# Patient Record
Sex: Male | Born: 1945
Health system: Southern US, Community
[De-identification: ages and names within clinical notes are randomized; demographics above are authoritative.]

## PROBLEM LIST (undated history)

## (undated) DIAGNOSIS — K645 Perianal venous thrombosis: Secondary | ICD-10-CM

## (undated) DIAGNOSIS — K219 Gastro-esophageal reflux disease without esophagitis: Secondary | ICD-10-CM

## (undated) DIAGNOSIS — I10 Essential (primary) hypertension: Secondary | ICD-10-CM

## (undated) DIAGNOSIS — E7800 Pure hypercholesterolemia, unspecified: Secondary | ICD-10-CM

## (undated) DIAGNOSIS — Z1211 Encounter for screening for malignant neoplasm of colon: Secondary | ICD-10-CM

## (undated) DIAGNOSIS — G500 Trigeminal neuralgia: Secondary | ICD-10-CM

## (undated) DIAGNOSIS — K449 Diaphragmatic hernia without obstruction or gangrene: Secondary | ICD-10-CM

## (undated) DIAGNOSIS — M542 Cervicalgia: Secondary | ICD-10-CM

## (undated) HISTORY — PX: OTHER SURGICAL HISTORY: SHX169

## (undated) HISTORY — DX: Diaphragmatic hernia without obstruction or gangrene: K44.9

## (undated) HISTORY — DX: Trigeminal neuralgia: G50.0

## (undated) HISTORY — DX: Pure hypercholesterolemia, unspecified: E78.00

## (undated) HISTORY — PX: APPENDECTOMY: SHX54

## (undated) HISTORY — DX: Perianal venous thrombosis: K64.5

## (undated) HISTORY — PX: CARPAL TUNNEL RELEASE: SHX101

## (undated) HISTORY — DX: Cervicalgia: M54.2

## (undated) HISTORY — DX: Gastro-esophageal reflux disease without esophagitis: K21.9

## (undated) HISTORY — DX: Essential (primary) hypertension: I10

## (undated) HISTORY — PX: ROTATOR CUFF REPAIR: SHX139

## (undated) HISTORY — PX: COLONOSCOPY: SHX174

## (undated) HISTORY — DX: Encounter for screening for malignant neoplasm of colon: Z12.11

---

## 2002-01-16 ENCOUNTER — Ambulatory Visit (HOSPITAL_BASED_OUTPATIENT_CLINIC_OR_DEPARTMENT_OTHER): Admission: RE | Admit: 2002-01-16 | Discharge: 2002-01-16 | Payer: Self-pay | Admitting: Orthopaedic Surgery

## 2005-09-22 ENCOUNTER — Encounter: Admission: RE | Admit: 2005-09-22 | Discharge: 2005-09-22 | Payer: Self-pay | Admitting: Internal Medicine

## 2005-10-01 ENCOUNTER — Ambulatory Visit (HOSPITAL_COMMUNITY): Admission: RE | Admit: 2005-10-01 | Discharge: 2005-10-01 | Payer: Self-pay | Admitting: Internal Medicine

## 2005-10-28 ENCOUNTER — Encounter: Admission: RE | Admit: 2005-10-28 | Discharge: 2005-10-28 | Payer: Self-pay | Admitting: Internal Medicine

## 2005-11-11 ENCOUNTER — Ambulatory Visit (HOSPITAL_COMMUNITY): Admission: RE | Admit: 2005-11-11 | Discharge: 2005-11-11 | Payer: Self-pay | Admitting: *Deleted

## 2005-11-11 ENCOUNTER — Encounter (INDEPENDENT_AMBULATORY_CARE_PROVIDER_SITE_OTHER): Payer: Self-pay | Admitting: Specialist

## 2007-01-06 ENCOUNTER — Encounter: Admission: RE | Admit: 2007-01-06 | Discharge: 2007-01-06 | Payer: Self-pay | Admitting: Internal Medicine

## 2007-02-02 ENCOUNTER — Encounter: Admission: RE | Admit: 2007-02-02 | Discharge: 2007-02-02 | Payer: Self-pay | Admitting: Internal Medicine

## 2008-08-09 IMAGING — US US ABDOMEN COMPLETE
1 series · 14 of 25 positions shown · non-contrast
Comparison: None.

CLINICAL DATA: Right upper quadrant discomfort.  Rule out gallbladder disease.
 ABDOMEN ULTRASOUND:
TECHNIQUE: Complete abdominal ultrasound examination was performed including evaluation of the liver, gallbladder, bile ducts, pancreas, kidneys, spleen, IVC, and abdominal aorta.

[Series 1: unknown · 0.32mm/px · 14 of 67 slices shown]
[im 1/67]
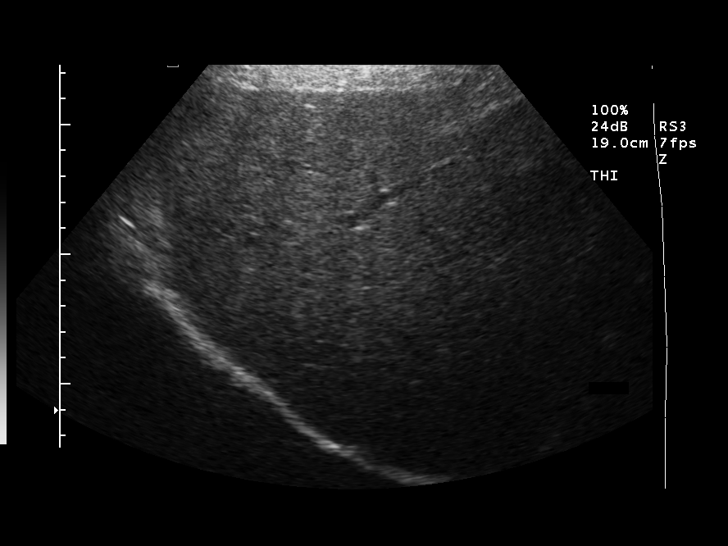
[im 6/67]
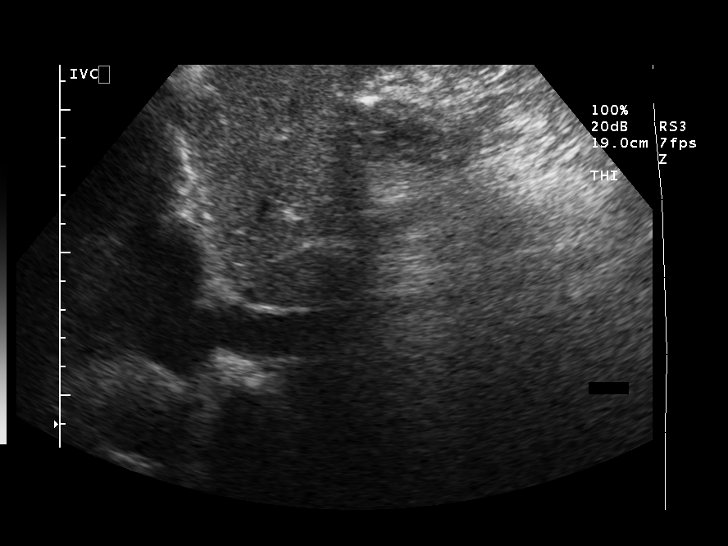
[im 12/67]
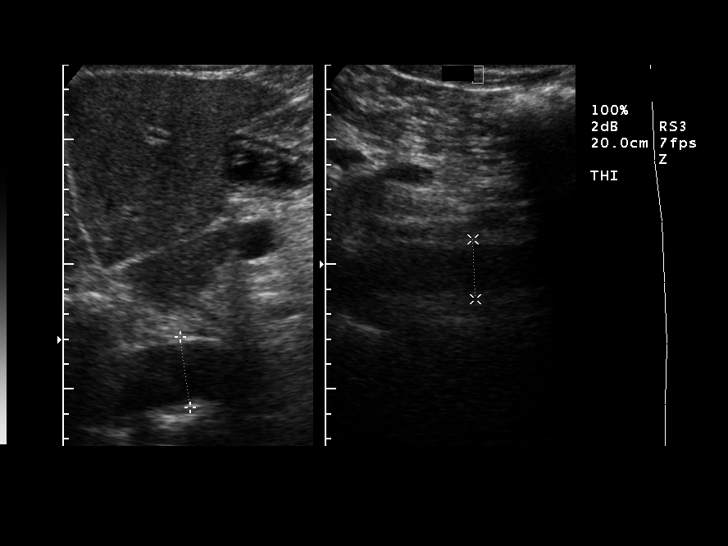
[im 17/67]
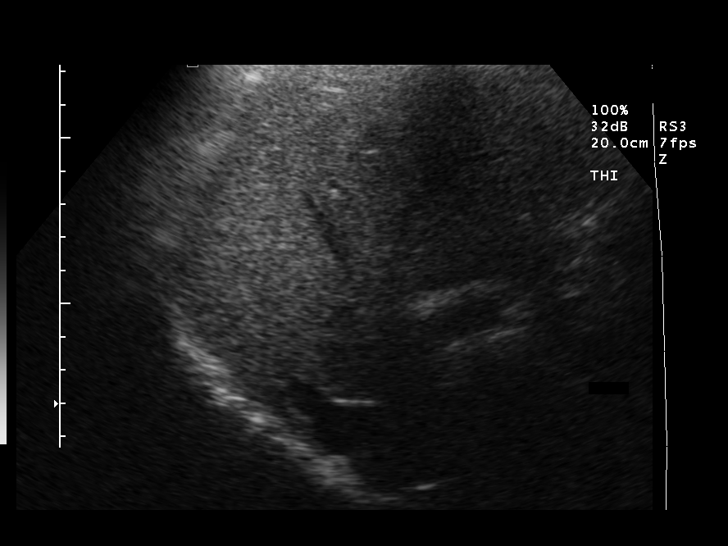
[im 23/67]
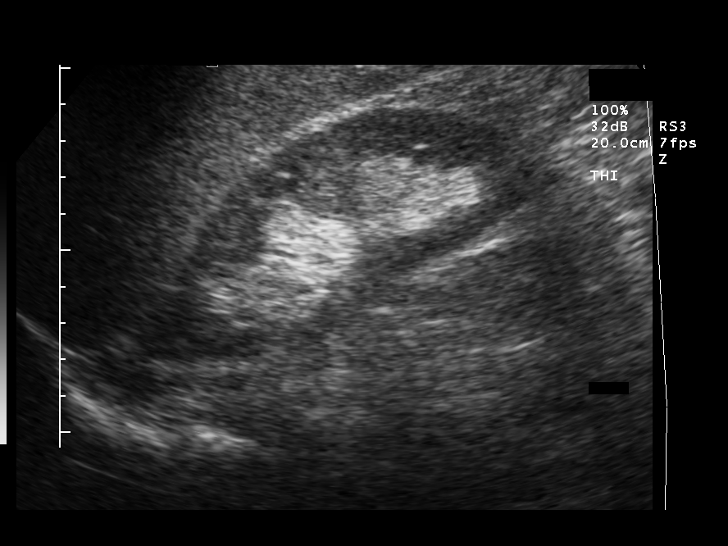
[im 25/67]
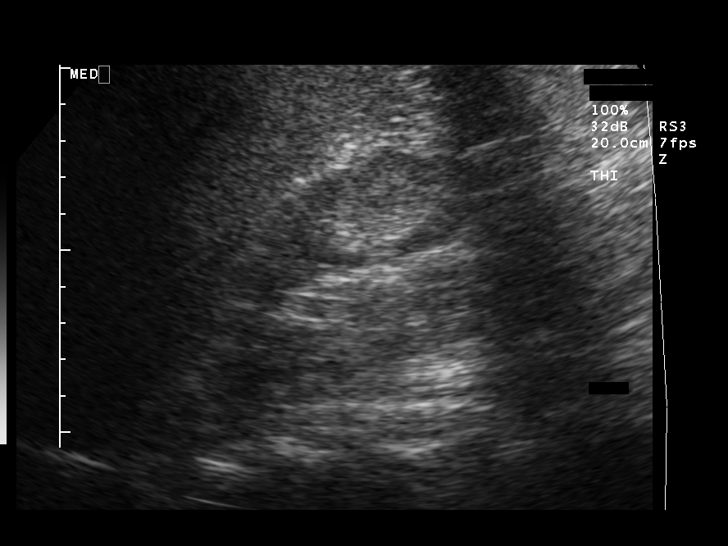
[im 31/67]
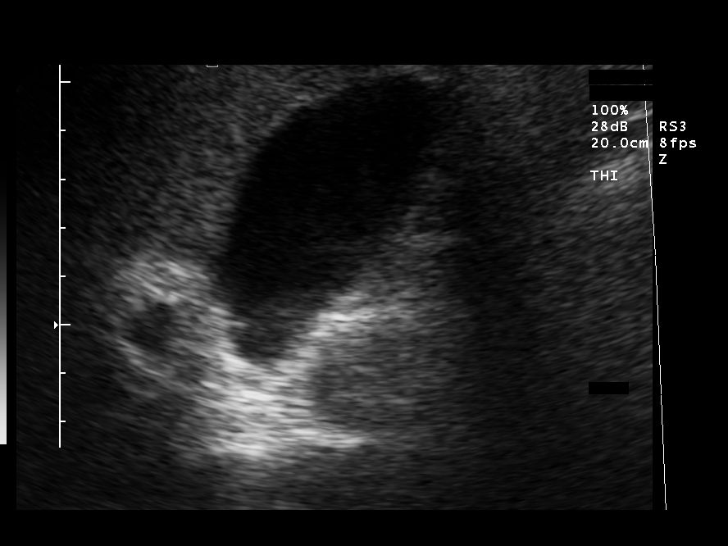
[im 36/67]
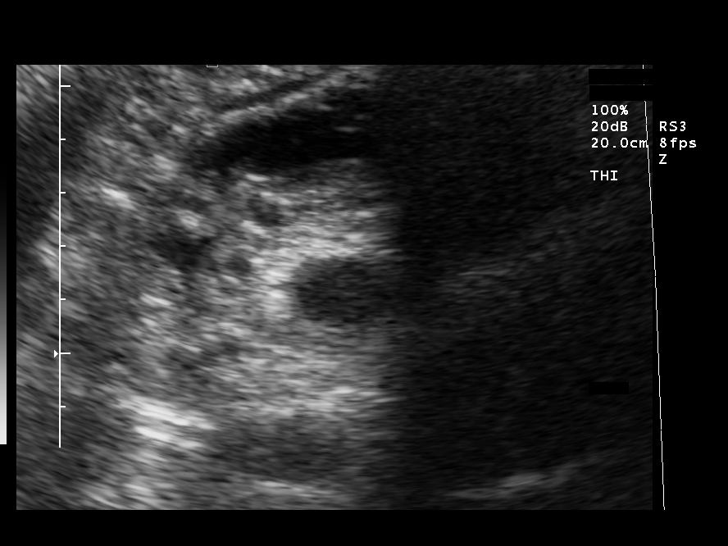
[im 42/67]
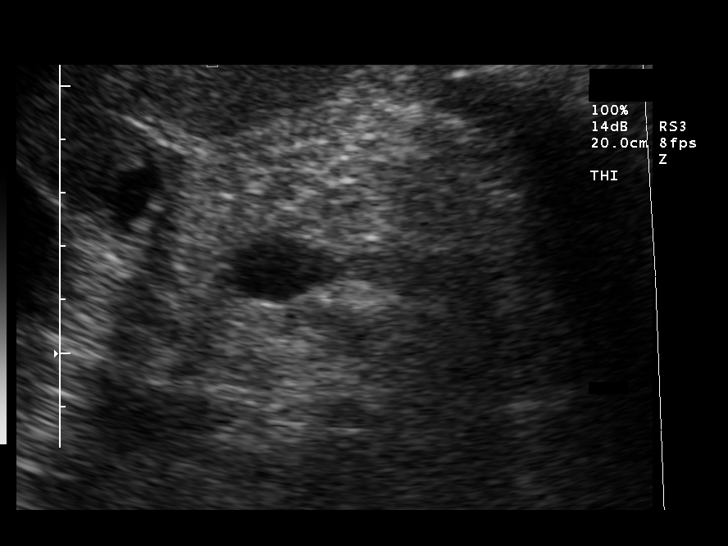
[im 45/67]
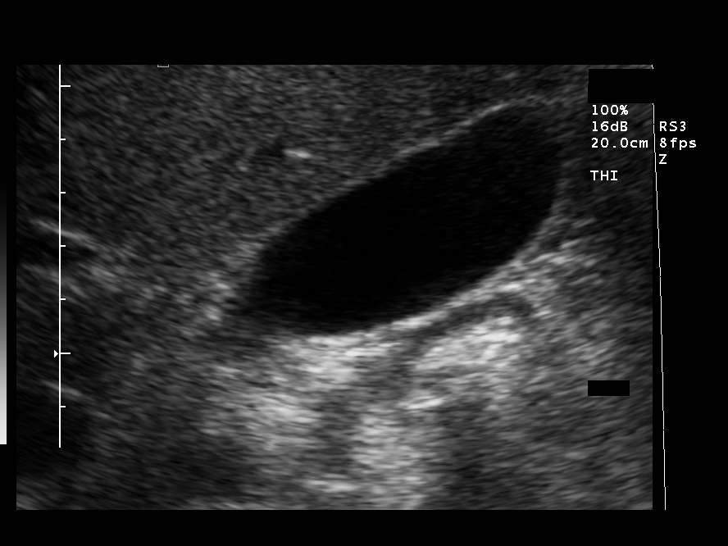
[im 50/67]
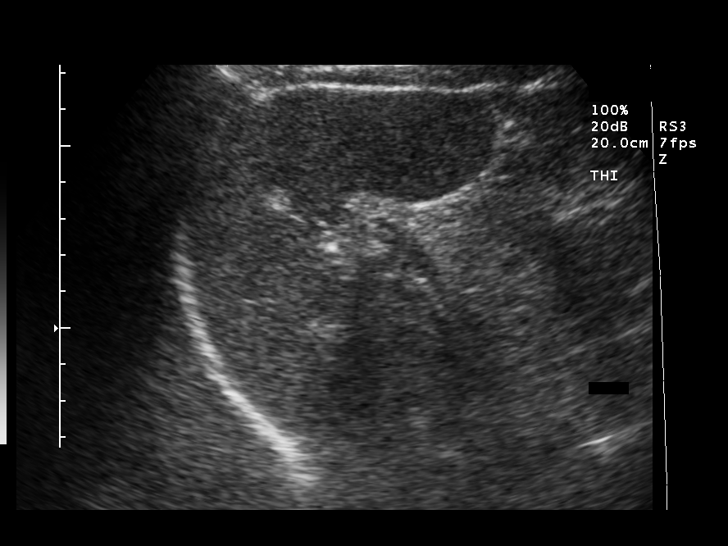
[im 56/67]
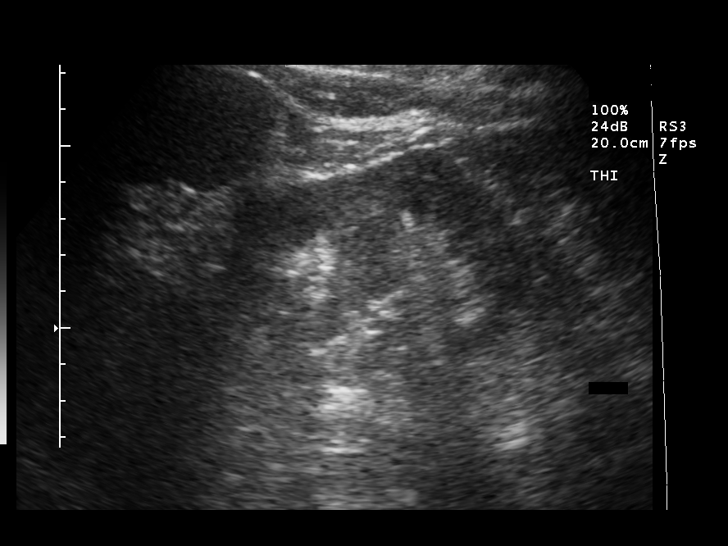
[im 61/67]
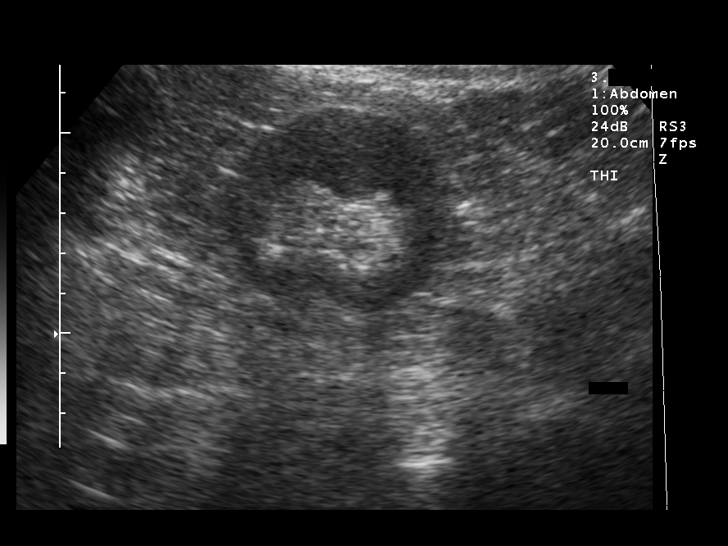
[im 67/67]
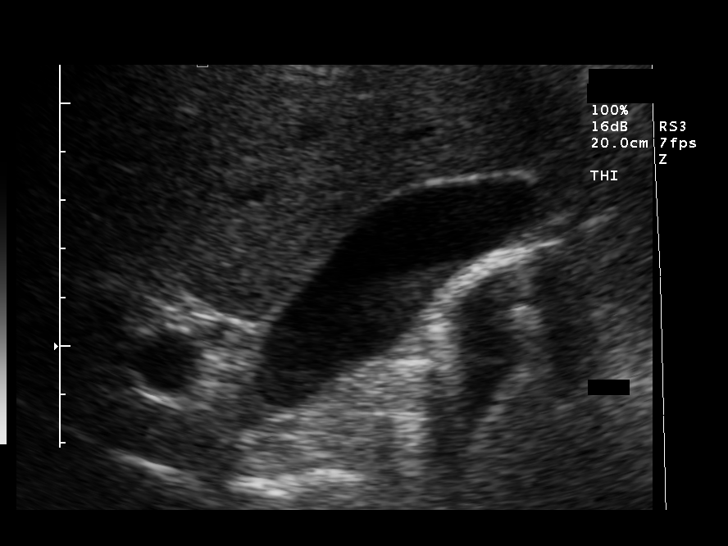

[14 of 25 positions shown; findings below may reference images not displayed]

Gallbladder is normal without wall thickening, stone, or pericholecystic fluid.  The common duct is normal at 4 mm. 
 The liver is normal in echotexture.  Limited evaluation of the IVC and pancreas due to overlying bowel gas.  
 The spleen is normal in size and echotexture. 
 The right kidney is 12.5 and the left kidney is 12.4 cm.  No hydronephrosis. 
 The proximal abdominal aorta measures 2.9 cm.  Distal abdominal aorta is poorly visualized due to overlying bowel gas.
IMPRESSION: 1.  No evidence of cholelithiasis or cholecystitis.
 2.  Somewhat limited exam, as described above.

## 2010-05-15 ENCOUNTER — Inpatient Hospital Stay (INDEPENDENT_AMBULATORY_CARE_PROVIDER_SITE_OTHER)
Admission: RE | Admit: 2010-05-15 | Discharge: 2010-05-15 | Disposition: A | Discharge status: Home / Self Care | Payer: BC Managed Care – PPO | Source: Ambulatory Visit | Attending: Family Medicine | Admitting: Family Medicine

## 2010-05-15 DIAGNOSIS — M549 Dorsalgia, unspecified: Secondary | ICD-10-CM

## 2010-05-22 NOTE — Op Note (Signed)
NAME:  Scott Mclean, Scott Mclean                          ACCOUNT NO.:  [id number]   MEDICAL RECORD NO.:  [id number]                   PATIENT TYPE:  AMB   LOCATION:  DSC                                  FACILITY:  [facility]   PHYSICIAN:  Lubertha Basque. Jerl Santos, M.D.             DATE OF BIRTH:  [birth year]   DATE OF PROCEDURE:  01/16/2002  DATE OF DISCHARGE:                                 OPERATIVE REPORT   PREOPERATIVE DIAGNOSIS:  Left carpal tunnel syndrome.   POSTOPERATIVE DIAGNOSIS:  Left carpal tunnel syndrome.   PROCEDURE:  Left carpal tunnel release.   ANESTHESIA:  Bier block, MAC.   ATTENDING SURGEON:  Lubertha Basque. Jerl Santos, M.D.   ASSISTANT:  Lindwood Qua, P.A.   INDICATIONS FOR PROCEDURE:  The patient is a 65 year old male with a long  history of hand pain and numbness bilateral.  He has failed conservative  measures and underwent a nerve conduction study which showed very severe  left-sided carpal tunnel syndrome and severe right-sided carpal tunnel  syndrome.  He is offered operative release at this point.  The procedure was  discussed with the patient and informed operative consent was obtained after  discussing possible complications, drug reactions to anesthesia, infection  and neurovascular injury.   DESCRIPTION OF PROCEDURE:  The patient was taken to the operating suite  where a Bier block was applied to the level of his forearm.  He was also  given some sedation.  His position was supine and prepped in normal sterile  fashion.  After  administration of  IV antibiotics, a small palmar incision  was made ulnar to the thenar flexion crease.  Dissection was carried down to  the palmar fascia to expose the transverse carpal ligament which was  released under direct visualization.  Dissection was taken distally to the  transverse arch of vessels and proximally to the distal fascia of the  forearm.  Once the release had been completed, the surgeon's small finger  could be inserted  through the carpal tunnel in the forearm without any  difficulty.  He did have severe compression and very significant thickening  of the transverse carpal ligament.  The wound was irrigated followed by  closure of the skin with nylon.  Adaptic was placed on the wound followed by  dry gauze and a Volar splint applied with wrist in extension.  The  tourniquet was deflated and the fingers all became pink and warm  immediately.  Estimated blood loss and intraoperative fluids obtained from  anesthesia records as for an accurate tourniquet time.   DISPOSITION:  The patient was taken to recovery room in stable condition.   PLAN:  For him to go home same day and follow up in the office in one week.  I will contact him by phone tonight.  Lubertha Basque Jerl Santos, M.D.    PGD/MEDQ  D:  01/16/2002  T:  01/16/2002  Job:  [id number]

## 2010-05-22 NOTE — Op Note (Signed)
NAMEWILLY, Scott Mclean                ACCOUNT NO.:  [id number]   MEDICAL RECORD NO.:  [id number]          PATIENT TYPE:  AMB   LOCATION:  ENDO                         FACILITY:  [facility]   PHYSICIAN:  Georgiana Spinner, M.D.    DATE OF BIRTH:  1945/10/17   DATE OF PROCEDURE:  11/11/2005  DATE OF DISCHARGE:                                 OPERATIVE REPORT   PROCEDURE:  Colonoscopy.   INDICATIONS:  Colon polyps.   ANESTHESIA:  Fentanyl 25 mcg, Versed 4 mg.   DESCRIPTION OF PROCEDURE:  With the patient mildly sedated in the left  lateral decubitus position, the Olympus videoscopic colonoscope was inserted  in the rectum after a rectal examination was attempted and passed under  direct vision to the cecum identified by the ileocecal valve and appendiceal  orifice and, also the terminal ileum which was entered.  Photographs were  taken. From this point, the colonoscope was slowly withdrawn taking  circumferential views of colonic mucosa stopping at 40 cm from anal verge at  which point two polyps were seen adjacent to each other, one was  photographed, and both were removed using hot biopsy forceps technique on a  setting of 20/200 blended current. The endoscope was then withdrawn to the  rectum which appeared normal on direct and showed hemorrhoids on retroflexed  view. The endoscope straightened and withdrawn.  The patient's vital signs  and pulse oximeter remained stable.  The patient tolerated the procedure  well without apparent complications.   FINDINGS:  Internal hemorrhoids, two polyps 40 cm, otherwise, an  unremarkable colonoscopic examination.   PLAN:  Await biopsy report.  The patient will call me for results and follow-  up with me as an outpatient.           ______________________________  Georgiana Spinner, M.D.     GMO/MEDQ  D:  11/11/2005  T:  11/11/2005  Job:  [id number]

## 2010-05-22 NOTE — Op Note (Signed)
Scott Mclean, Scott Mclean                ACCOUNT NO.:  [id number]   MEDICAL RECORD NO.:  [id number]          PATIENT TYPE:  AMB   LOCATION:  ENDO                         FACILITY:  [facility]   PHYSICIAN:  Georgiana Spinner, M.D.    DATE OF BIRTH:  Jul 08, 1945   DATE OF PROCEDURE:  DATE OF DISCHARGE:                                 OPERATIVE REPORT   PROCEDURE:  Upper endoscopy.   INDICATIONS:  GERD.   ANESTHESIA:  Fentanyl 50 mcg, Versed 4 mg.   PROCEDURE:  With the patient mildly sedated in the left lateral decubitus  position, the Olympus videoscopic endoscope was inserted into the mouth,  passed under direct vision through the esophagus, which appeared normal  until it reached distal esophagus and there were changes of esophagitis and  Barrett esophagus.  Photographed and multiple biopsies taken.  We entered  into the stomach for hiatal hernia, fundus, body, antrum, duodenal bulb,  second portion of the duodenum were visualized.  From this point, the  endoscope was slowly withdrawn, taking circumferential views of the duodenal  mucosa until the endoscope had been pulled back into the stomach, placed in  retroflexion to view the stomach from below.  The endoscope was then  straightened and withdrawn, taking circumflex views of the remaining gastric  and esophageal mucosa.  The patient's vital signs and pulse oximetry  remained stable.  The patient tolerated the procedure well without apparent  complication.   FINDINGS:  Changes of esophagitis and Barrett esophagus above the hiatal  hernia, awaiting biopsy report.  The patient will call me for results and  follow up with me as an outpatient, receive a colonoscopy.           ______________________________  Georgiana Spinner, M.D.     GMO/MEDQ  D:  11/11/2005  T:  11/12/2005  Job:  [id number]

## 2013-01-04 DIAGNOSIS — S2239XA Fracture of one rib, unspecified side, initial encounter for closed fracture: Secondary | ICD-10-CM

## 2013-01-04 HISTORY — DX: Fracture of one rib, unspecified side, initial encounter for closed fracture: S22.39XA

## 2013-04-13 ENCOUNTER — Other Ambulatory Visit: Payer: Self-pay | Admitting: Gastroenterology

## 2014-11-18 DIAGNOSIS — J0100 Acute maxillary sinusitis, unspecified: Secondary | ICD-10-CM | POA: Diagnosis not present

## 2014-12-31 DIAGNOSIS — I10 Essential (primary) hypertension: Secondary | ICD-10-CM | POA: Diagnosis not present

## 2014-12-31 DIAGNOSIS — R739 Hyperglycemia, unspecified: Secondary | ICD-10-CM | POA: Diagnosis not present

## 2015-01-03 DIAGNOSIS — I10 Essential (primary) hypertension: Secondary | ICD-10-CM | POA: Diagnosis not present

## 2015-01-03 DIAGNOSIS — R7303 Prediabetes: Secondary | ICD-10-CM | POA: Diagnosis not present

## 2015-03-18 DIAGNOSIS — R05 Cough: Secondary | ICD-10-CM | POA: Diagnosis not present

## 2015-03-18 DIAGNOSIS — J0100 Acute maxillary sinusitis, unspecified: Secondary | ICD-10-CM | POA: Diagnosis not present

## 2015-06-24 DIAGNOSIS — E7800 Pure hypercholesterolemia, unspecified: Secondary | ICD-10-CM | POA: Diagnosis not present

## 2015-06-24 DIAGNOSIS — I10 Essential (primary) hypertension: Secondary | ICD-10-CM | POA: Diagnosis not present

## 2015-06-24 DIAGNOSIS — Z125 Encounter for screening for malignant neoplasm of prostate: Secondary | ICD-10-CM | POA: Diagnosis not present

## 2015-07-01 DIAGNOSIS — Z0000 Encounter for general adult medical examination without abnormal findings: Secondary | ICD-10-CM | POA: Diagnosis not present

## 2015-07-01 DIAGNOSIS — I10 Essential (primary) hypertension: Secondary | ICD-10-CM | POA: Diagnosis not present

## 2015-07-01 DIAGNOSIS — Z23 Encounter for immunization: Secondary | ICD-10-CM | POA: Diagnosis not present

## 2015-07-01 DIAGNOSIS — Z1212 Encounter for screening for malignant neoplasm of rectum: Secondary | ICD-10-CM | POA: Diagnosis not present

## 2015-07-04 DIAGNOSIS — I10 Essential (primary) hypertension: Secondary | ICD-10-CM | POA: Diagnosis not present

## 2016-01-02 DIAGNOSIS — E785 Hyperlipidemia, unspecified: Secondary | ICD-10-CM | POA: Diagnosis not present

## 2016-01-02 DIAGNOSIS — I10 Essential (primary) hypertension: Secondary | ICD-10-CM | POA: Diagnosis not present

## 2016-01-16 DIAGNOSIS — M47816 Spondylosis without myelopathy or radiculopathy, lumbar region: Secondary | ICD-10-CM | POA: Diagnosis not present

## 2016-01-29 DIAGNOSIS — M47816 Spondylosis without myelopathy or radiculopathy, lumbar region: Secondary | ICD-10-CM | POA: Diagnosis not present

## 2016-02-04 DIAGNOSIS — M47816 Spondylosis without myelopathy or radiculopathy, lumbar region: Secondary | ICD-10-CM | POA: Diagnosis not present

## 2016-02-04 DIAGNOSIS — E785 Hyperlipidemia, unspecified: Secondary | ICD-10-CM | POA: Diagnosis not present

## 2016-02-04 DIAGNOSIS — I10 Essential (primary) hypertension: Secondary | ICD-10-CM | POA: Diagnosis not present

## 2016-03-10 DIAGNOSIS — M4696 Unspecified inflammatory spondylopathy, lumbar region: Secondary | ICD-10-CM | POA: Diagnosis not present

## 2016-03-12 DIAGNOSIS — M47816 Spondylosis without myelopathy or radiculopathy, lumbar region: Secondary | ICD-10-CM | POA: Diagnosis not present

## 2016-04-06 DIAGNOSIS — M545 Low back pain: Secondary | ICD-10-CM | POA: Diagnosis not present

## 2016-06-24 DIAGNOSIS — Z0000 Encounter for general adult medical examination without abnormal findings: Secondary | ICD-10-CM | POA: Diagnosis not present

## 2016-06-24 DIAGNOSIS — Z125 Encounter for screening for malignant neoplasm of prostate: Secondary | ICD-10-CM | POA: Diagnosis not present

## 2016-06-24 DIAGNOSIS — I10 Essential (primary) hypertension: Secondary | ICD-10-CM | POA: Diagnosis not present

## 2016-07-01 DIAGNOSIS — R739 Hyperglycemia, unspecified: Secondary | ICD-10-CM | POA: Diagnosis not present

## 2016-07-01 DIAGNOSIS — I10 Essential (primary) hypertension: Secondary | ICD-10-CM | POA: Diagnosis not present

## 2016-07-01 DIAGNOSIS — Z0000 Encounter for general adult medical examination without abnormal findings: Secondary | ICD-10-CM | POA: Diagnosis not present

## 2016-07-04 DIAGNOSIS — J0100 Acute maxillary sinusitis, unspecified: Secondary | ICD-10-CM | POA: Diagnosis not present

## 2016-07-10 DIAGNOSIS — J0100 Acute maxillary sinusitis, unspecified: Secondary | ICD-10-CM | POA: Diagnosis not present

## 2016-07-29 DIAGNOSIS — D126 Benign neoplasm of colon, unspecified: Secondary | ICD-10-CM | POA: Diagnosis not present

## 2016-07-29 DIAGNOSIS — K5730 Diverticulosis of large intestine without perforation or abscess without bleeding: Secondary | ICD-10-CM | POA: Diagnosis not present

## 2016-07-29 DIAGNOSIS — Z86010 Personal history of colonic polyps: Secondary | ICD-10-CM | POA: Diagnosis not present

## 2016-07-29 DIAGNOSIS — K635 Polyp of colon: Secondary | ICD-10-CM | POA: Diagnosis not present

## 2016-07-30 DIAGNOSIS — K635 Polyp of colon: Secondary | ICD-10-CM | POA: Diagnosis not present

## 2016-07-30 DIAGNOSIS — D126 Benign neoplasm of colon, unspecified: Secondary | ICD-10-CM | POA: Diagnosis not present

## 2016-08-20 DIAGNOSIS — I10 Essential (primary) hypertension: Secondary | ICD-10-CM | POA: Diagnosis not present

## 2016-08-20 DIAGNOSIS — E785 Hyperlipidemia, unspecified: Secondary | ICD-10-CM | POA: Diagnosis not present

## 2016-10-13 DIAGNOSIS — L03116 Cellulitis of left lower limb: Secondary | ICD-10-CM | POA: Diagnosis not present

## 2016-10-21 DIAGNOSIS — R0789 Other chest pain: Secondary | ICD-10-CM | POA: Diagnosis not present

## 2016-10-21 DIAGNOSIS — I10 Essential (primary) hypertension: Secondary | ICD-10-CM | POA: Diagnosis not present

## 2016-10-28 DIAGNOSIS — R739 Hyperglycemia, unspecified: Secondary | ICD-10-CM | POA: Diagnosis not present

## 2016-10-28 DIAGNOSIS — I10 Essential (primary) hypertension: Secondary | ICD-10-CM | POA: Diagnosis not present

## 2016-11-04 DIAGNOSIS — R739 Hyperglycemia, unspecified: Secondary | ICD-10-CM | POA: Diagnosis not present

## 2016-11-04 DIAGNOSIS — Z125 Encounter for screening for malignant neoplasm of prostate: Secondary | ICD-10-CM | POA: Diagnosis not present

## 2016-11-04 DIAGNOSIS — I10 Essential (primary) hypertension: Secondary | ICD-10-CM | POA: Diagnosis not present

## 2016-11-04 DIAGNOSIS — E7800 Pure hypercholesterolemia, unspecified: Secondary | ICD-10-CM | POA: Diagnosis not present

## 2016-11-04 DIAGNOSIS — Z0000 Encounter for general adult medical examination without abnormal findings: Secondary | ICD-10-CM | POA: Diagnosis not present

## 2017-01-15 DIAGNOSIS — J019 Acute sinusitis, unspecified: Secondary | ICD-10-CM | POA: Diagnosis not present

## 2017-01-15 DIAGNOSIS — J309 Allergic rhinitis, unspecified: Secondary | ICD-10-CM | POA: Diagnosis not present

## 2017-01-18 DIAGNOSIS — J0100 Acute maxillary sinusitis, unspecified: Secondary | ICD-10-CM | POA: Diagnosis not present

## 2017-01-18 DIAGNOSIS — R509 Fever, unspecified: Secondary | ICD-10-CM | POA: Diagnosis not present

## 2017-02-07 ENCOUNTER — Encounter: Payer: Self-pay | Admitting: Podiatry

## 2017-02-07 ENCOUNTER — Ambulatory Visit: Payer: Medicare Other | Admitting: Podiatry

## 2017-02-07 VITALS — BP 126/78 | HR 71 | Resp 18

## 2017-02-07 DIAGNOSIS — L600 Ingrowing nail: Secondary | ICD-10-CM

## 2017-02-07 DIAGNOSIS — M79676 Pain in unspecified toe(s): Secondary | ICD-10-CM | POA: Diagnosis not present

## 2017-02-07 DIAGNOSIS — B351 Tinea unguium: Secondary | ICD-10-CM | POA: Diagnosis not present

## 2017-02-07 MED ORDER — FLUCONAZOLE 150 MG PO TABS: 150.0000 mg | ORAL_TABLET | ORAL | 1 refills | Status: DC

## 2017-02-07 NOTE — Patient Instructions (Signed)

## 2017-02-07 NOTE — Progress Notes (Signed)
  Subjective:  Patient ID: Scott Mclean, male    DOB: 1945/11/10,  MRN: [id number]  Chief Complaint  Patient presents with  . Foot Problem    i have some toenails that are thick and i am not a diabetic     72 y.o. male presents with a chronic ingrown toenail to the left great toe. Had this issue on the right great toe in the 70s and had it permanently removed. Would like permanent solution so the nail does not grow back and continue to cause him pain.  Also complains of general thickness and discoloration to all the toenails.  History reviewed. No pertinent past medical history. History reviewed. No pertinent surgical history.  Current Outpatient Medications:  .  fluconazole (DIFLUCAN) 150 MG tablet, Take 1 tablet (150 mg total) by mouth once a week., Disp: 6 tablet, Rfl: 1 .  hydrochlorothiazide (HYDRODIURIL) 25 MG tablet, , Disp: , Rfl:  .  olmesartan (BENICAR) 40 MG tablet, , Disp: , Rfl:   Allergies  Allergen Reactions  . Lisinopril    Review of Systems Objective:   Vitals:   02/07/17 1344  BP: 126/78  Pulse: 71  Resp: 18   General AA&O x3. Normal mood and affect.  Vascular Dorsalis pedis and posterior tibial pulses  present 2+ bilaterally  Capillary refill normal to all digits. Pedal hair growth normal.  Neurologic Epicritic sensation grossly present.  Dermatologic No open lesions. Interspaces clear of maceration. Nails well groomed and normal in appearance. Painful ingrowing nail at lateral nail borders of the hallux nail left. Thickening, brittleness, and yellow discoloration noted to the toenails with subungual debris.  Orthopedic: MMT 5/5 in dorsiflexion, plantarflexion, inversion, and eversion. Normal joint ROM without pain or crepitus. Pain to palpation about the ingrown nail.   Assessment & Plan:  Patient was evaluated and treated and all questions answered.  Ingrown Nail, left -Patient elects to proceed with ingrown toenail removal today -Ingrown  nail excised. See procedure note. -Educated on post-procedure care including soaking. Written instructions provided. -Patient to follow up in 2 weeks for nail check.  Procedure: Excision of Ingrown Toenail Location: Left 1st toe lateral nail borders. Anesthesia: Lidocaine 1% plain; 1.5 mL and Marcaine 0.5% plain; 1.5 mL, digital block. Skin Prep: Betadine. Dressing: Silvadene; telfa; dry, sterile, compression dressing. Technique: Following skin prep, the toe was exsanguinated and a tourniquet was secured at the base of the toe. The affected nail border was freed, split with a nail splitter, and excised. Chemical matrixectomy was then performed with phenol and irrigated out with alcohol. The tourniquet was then removed and sterile dressing applied. Disposition: Patient tolerated procedure well. Patient to return in 2 weeks for follow-up.   Onychomycosis -Oral options reviewed. Will rx oral fluconazole weekly. Discussed low risk of liver injury, no need for monitoring with this medication.  Return in about 2 weeks (around 02/21/2017) for Nail Check.

## 2017-02-22 ENCOUNTER — Ambulatory Visit (INDEPENDENT_AMBULATORY_CARE_PROVIDER_SITE_OTHER): Payer: Medicare Other | Admitting: Podiatry

## 2017-02-22 DIAGNOSIS — Z9889 Other specified postprocedural states: Secondary | ICD-10-CM

## 2017-02-22 NOTE — Progress Notes (Signed)
  Subjective:  Patient ID: Scott Mclean, male    DOB: [birth year],  MRN: [id number]  Chief Complaint  Patient presents with  . Ingrown Toenail    2 wk fu ingrown nail removal doing well    72 y.o. male returns for the above complaint. Doing well. States he is pleased the nail no longer hurts him as it used to.  Objective:   General AA&O x3. Normal mood and affect.  Vascular Foot warm and well perfused with good capillary refill.  Neurologic Sensation grossly intact.  Dermatologic Nail avulsion site healing well without drainage or erythema. Nail bed with overlying soft crust No signs of local infection.  Orthopedic: No tenderness to palpation of the toe.   Assessment & Plan:  Patient was evaluated and treated and all questions answered.  S/p Ingrown Toenail Excision, Left -Healing well without issue. -Discussed continued soaking until redness resolves. -Nail bed gently debrided with a curette. -Discussed return precautions. -F/u PRN

## 2017-04-05 DIAGNOSIS — I10 Essential (primary) hypertension: Secondary | ICD-10-CM | POA: Diagnosis not present

## 2017-05-14 DIAGNOSIS — J019 Acute sinusitis, unspecified: Secondary | ICD-10-CM | POA: Diagnosis not present

## 2017-07-11 DIAGNOSIS — Z125 Encounter for screening for malignant neoplasm of prostate: Secondary | ICD-10-CM | POA: Diagnosis not present

## 2017-07-11 DIAGNOSIS — R739 Hyperglycemia, unspecified: Secondary | ICD-10-CM | POA: Diagnosis not present

## 2017-07-11 DIAGNOSIS — Z0000 Encounter for general adult medical examination without abnormal findings: Secondary | ICD-10-CM | POA: Diagnosis not present

## 2017-07-11 DIAGNOSIS — I10 Essential (primary) hypertension: Secondary | ICD-10-CM | POA: Diagnosis not present

## 2017-07-14 DIAGNOSIS — E7800 Pure hypercholesterolemia, unspecified: Secondary | ICD-10-CM | POA: Diagnosis not present

## 2017-07-14 DIAGNOSIS — K219 Gastro-esophageal reflux disease without esophagitis: Secondary | ICD-10-CM | POA: Diagnosis not present

## 2017-07-14 DIAGNOSIS — Z0000 Encounter for general adult medical examination without abnormal findings: Secondary | ICD-10-CM | POA: Diagnosis not present

## 2017-07-14 DIAGNOSIS — I10 Essential (primary) hypertension: Secondary | ICD-10-CM | POA: Diagnosis not present

## 2017-07-14 DIAGNOSIS — K449 Diaphragmatic hernia without obstruction or gangrene: Secondary | ICD-10-CM | POA: Diagnosis not present

## 2017-08-05 DIAGNOSIS — H61001 Unspecified perichondritis of right external ear: Secondary | ICD-10-CM | POA: Diagnosis not present

## 2017-08-05 DIAGNOSIS — D225 Melanocytic nevi of trunk: Secondary | ICD-10-CM | POA: Diagnosis not present

## 2017-08-22 DIAGNOSIS — M25512 Pain in left shoulder: Secondary | ICD-10-CM | POA: Diagnosis not present

## 2017-08-25 DIAGNOSIS — Z0189 Encounter for other specified special examinations: Secondary | ICD-10-CM | POA: Diagnosis not present

## 2017-08-25 DIAGNOSIS — I10 Essential (primary) hypertension: Secondary | ICD-10-CM | POA: Diagnosis not present

## 2017-09-26 DIAGNOSIS — M67912 Unspecified disorder of synovium and tendon, left shoulder: Secondary | ICD-10-CM | POA: Diagnosis not present

## 2017-09-27 DIAGNOSIS — M47816 Spondylosis without myelopathy or radiculopathy, lumbar region: Secondary | ICD-10-CM | POA: Diagnosis not present

## 2017-10-01 DIAGNOSIS — M25512 Pain in left shoulder: Secondary | ICD-10-CM | POA: Diagnosis not present

## 2017-10-03 DIAGNOSIS — M25512 Pain in left shoulder: Secondary | ICD-10-CM | POA: Diagnosis not present

## 2017-10-13 DIAGNOSIS — M75122 Complete rotator cuff tear or rupture of left shoulder, not specified as traumatic: Secondary | ICD-10-CM | POA: Diagnosis not present

## 2017-10-13 DIAGNOSIS — M24112 Other articular cartilage disorders, left shoulder: Secondary | ICD-10-CM | POA: Diagnosis not present

## 2017-10-13 DIAGNOSIS — M7522 Bicipital tendinitis, left shoulder: Secondary | ICD-10-CM | POA: Diagnosis not present

## 2017-10-13 DIAGNOSIS — G8918 Other acute postprocedural pain: Secondary | ICD-10-CM | POA: Diagnosis not present

## 2017-10-26 DIAGNOSIS — Z9889 Other specified postprocedural states: Secondary | ICD-10-CM | POA: Diagnosis not present

## 2017-11-08 DIAGNOSIS — J069 Acute upper respiratory infection, unspecified: Secondary | ICD-10-CM | POA: Diagnosis not present

## 2017-11-16 DIAGNOSIS — Z9889 Other specified postprocedural states: Secondary | ICD-10-CM | POA: Diagnosis not present

## 2017-11-21 DIAGNOSIS — Z4789 Encounter for other orthopedic aftercare: Secondary | ICD-10-CM | POA: Diagnosis not present

## 2017-11-21 DIAGNOSIS — M75102 Unspecified rotator cuff tear or rupture of left shoulder, not specified as traumatic: Secondary | ICD-10-CM | POA: Diagnosis not present

## 2017-11-21 DIAGNOSIS — M25512 Pain in left shoulder: Secondary | ICD-10-CM | POA: Diagnosis not present

## 2017-11-25 DIAGNOSIS — M25512 Pain in left shoulder: Secondary | ICD-10-CM | POA: Diagnosis not present

## 2017-11-25 DIAGNOSIS — Z4789 Encounter for other orthopedic aftercare: Secondary | ICD-10-CM | POA: Diagnosis not present

## 2017-11-25 DIAGNOSIS — M75102 Unspecified rotator cuff tear or rupture of left shoulder, not specified as traumatic: Secondary | ICD-10-CM | POA: Diagnosis not present

## 2017-11-28 DIAGNOSIS — M25512 Pain in left shoulder: Secondary | ICD-10-CM | POA: Diagnosis not present

## 2017-11-28 DIAGNOSIS — M75102 Unspecified rotator cuff tear or rupture of left shoulder, not specified as traumatic: Secondary | ICD-10-CM | POA: Diagnosis not present

## 2017-11-28 DIAGNOSIS — Z4789 Encounter for other orthopedic aftercare: Secondary | ICD-10-CM | POA: Diagnosis not present

## 2017-12-05 DIAGNOSIS — R251 Tremor, unspecified: Secondary | ICD-10-CM | POA: Diagnosis not present

## 2017-12-05 DIAGNOSIS — I10 Essential (primary) hypertension: Secondary | ICD-10-CM | POA: Diagnosis not present

## 2017-12-06 DIAGNOSIS — Z4789 Encounter for other orthopedic aftercare: Secondary | ICD-10-CM | POA: Diagnosis not present

## 2017-12-06 DIAGNOSIS — M25512 Pain in left shoulder: Secondary | ICD-10-CM | POA: Diagnosis not present

## 2017-12-06 DIAGNOSIS — M75102 Unspecified rotator cuff tear or rupture of left shoulder, not specified as traumatic: Secondary | ICD-10-CM | POA: Diagnosis not present

## 2017-12-08 DIAGNOSIS — Z4789 Encounter for other orthopedic aftercare: Secondary | ICD-10-CM | POA: Diagnosis not present

## 2017-12-08 DIAGNOSIS — M75102 Unspecified rotator cuff tear or rupture of left shoulder, not specified as traumatic: Secondary | ICD-10-CM | POA: Diagnosis not present

## 2017-12-08 DIAGNOSIS — M25512 Pain in left shoulder: Secondary | ICD-10-CM | POA: Diagnosis not present

## 2017-12-14 DIAGNOSIS — Z4789 Encounter for other orthopedic aftercare: Secondary | ICD-10-CM | POA: Diagnosis not present

## 2017-12-14 DIAGNOSIS — M75102 Unspecified rotator cuff tear or rupture of left shoulder, not specified as traumatic: Secondary | ICD-10-CM | POA: Diagnosis not present

## 2017-12-14 DIAGNOSIS — M25512 Pain in left shoulder: Secondary | ICD-10-CM | POA: Diagnosis not present

## 2017-12-14 DIAGNOSIS — Z9889 Other specified postprocedural states: Secondary | ICD-10-CM | POA: Diagnosis not present

## 2017-12-21 DIAGNOSIS — M25512 Pain in left shoulder: Secondary | ICD-10-CM | POA: Diagnosis not present

## 2017-12-21 DIAGNOSIS — Z4789 Encounter for other orthopedic aftercare: Secondary | ICD-10-CM | POA: Diagnosis not present

## 2017-12-21 DIAGNOSIS — M75102 Unspecified rotator cuff tear or rupture of left shoulder, not specified as traumatic: Secondary | ICD-10-CM | POA: Diagnosis not present

## 2017-12-23 DIAGNOSIS — M75102 Unspecified rotator cuff tear or rupture of left shoulder, not specified as traumatic: Secondary | ICD-10-CM | POA: Diagnosis not present

## 2017-12-23 DIAGNOSIS — Z4789 Encounter for other orthopedic aftercare: Secondary | ICD-10-CM | POA: Diagnosis not present

## 2017-12-23 DIAGNOSIS — M25512 Pain in left shoulder: Secondary | ICD-10-CM | POA: Diagnosis not present

## 2017-12-27 DIAGNOSIS — M25512 Pain in left shoulder: Secondary | ICD-10-CM | POA: Diagnosis not present

## 2017-12-27 DIAGNOSIS — Z4789 Encounter for other orthopedic aftercare: Secondary | ICD-10-CM | POA: Diagnosis not present

## 2017-12-27 DIAGNOSIS — M75102 Unspecified rotator cuff tear or rupture of left shoulder, not specified as traumatic: Secondary | ICD-10-CM | POA: Diagnosis not present

## 2017-12-30 DIAGNOSIS — M75102 Unspecified rotator cuff tear or rupture of left shoulder, not specified as traumatic: Secondary | ICD-10-CM | POA: Diagnosis not present

## 2017-12-30 DIAGNOSIS — M25512 Pain in left shoulder: Secondary | ICD-10-CM | POA: Diagnosis not present

## 2017-12-30 DIAGNOSIS — Z4789 Encounter for other orthopedic aftercare: Secondary | ICD-10-CM | POA: Diagnosis not present

## 2018-01-02 DIAGNOSIS — Z4789 Encounter for other orthopedic aftercare: Secondary | ICD-10-CM | POA: Diagnosis not present

## 2018-01-02 DIAGNOSIS — M75102 Unspecified rotator cuff tear or rupture of left shoulder, not specified as traumatic: Secondary | ICD-10-CM | POA: Diagnosis not present

## 2018-01-02 DIAGNOSIS — M25512 Pain in left shoulder: Secondary | ICD-10-CM | POA: Diagnosis not present

## 2018-01-05 DIAGNOSIS — M75102 Unspecified rotator cuff tear or rupture of left shoulder, not specified as traumatic: Secondary | ICD-10-CM | POA: Diagnosis not present

## 2018-01-05 DIAGNOSIS — M25512 Pain in left shoulder: Secondary | ICD-10-CM | POA: Diagnosis not present

## 2018-01-05 DIAGNOSIS — Z4789 Encounter for other orthopedic aftercare: Secondary | ICD-10-CM | POA: Diagnosis not present

## 2018-01-10 DIAGNOSIS — E7800 Pure hypercholesterolemia, unspecified: Secondary | ICD-10-CM | POA: Diagnosis not present

## 2018-01-10 DIAGNOSIS — R739 Hyperglycemia, unspecified: Secondary | ICD-10-CM | POA: Diagnosis not present

## 2018-01-10 DIAGNOSIS — M47816 Spondylosis without myelopathy or radiculopathy, lumbar region: Secondary | ICD-10-CM | POA: Diagnosis not present

## 2018-01-10 DIAGNOSIS — I10 Essential (primary) hypertension: Secondary | ICD-10-CM | POA: Diagnosis not present

## 2018-01-11 DIAGNOSIS — M75102 Unspecified rotator cuff tear or rupture of left shoulder, not specified as traumatic: Secondary | ICD-10-CM | POA: Diagnosis not present

## 2018-01-11 DIAGNOSIS — M25512 Pain in left shoulder: Secondary | ICD-10-CM | POA: Diagnosis not present

## 2018-01-11 DIAGNOSIS — Z4789 Encounter for other orthopedic aftercare: Secondary | ICD-10-CM | POA: Diagnosis not present

## 2018-01-16 DIAGNOSIS — E7800 Pure hypercholesterolemia, unspecified: Secondary | ICD-10-CM | POA: Diagnosis not present

## 2018-01-16 DIAGNOSIS — I10 Essential (primary) hypertension: Secondary | ICD-10-CM | POA: Diagnosis not present

## 2018-01-16 DIAGNOSIS — R739 Hyperglycemia, unspecified: Secondary | ICD-10-CM | POA: Diagnosis not present

## 2018-01-20 DIAGNOSIS — M75102 Unspecified rotator cuff tear or rupture of left shoulder, not specified as traumatic: Secondary | ICD-10-CM | POA: Diagnosis not present

## 2018-01-20 DIAGNOSIS — M25512 Pain in left shoulder: Secondary | ICD-10-CM | POA: Diagnosis not present

## 2018-01-20 DIAGNOSIS — Z4789 Encounter for other orthopedic aftercare: Secondary | ICD-10-CM | POA: Diagnosis not present

## 2018-01-26 DIAGNOSIS — M25512 Pain in left shoulder: Secondary | ICD-10-CM | POA: Diagnosis not present

## 2018-01-26 DIAGNOSIS — M75102 Unspecified rotator cuff tear or rupture of left shoulder, not specified as traumatic: Secondary | ICD-10-CM | POA: Diagnosis not present

## 2018-01-26 DIAGNOSIS — Z4789 Encounter for other orthopedic aftercare: Secondary | ICD-10-CM | POA: Diagnosis not present

## 2018-02-03 DIAGNOSIS — M75102 Unspecified rotator cuff tear or rupture of left shoulder, not specified as traumatic: Secondary | ICD-10-CM | POA: Diagnosis not present

## 2018-02-03 DIAGNOSIS — Z4789 Encounter for other orthopedic aftercare: Secondary | ICD-10-CM | POA: Diagnosis not present

## 2018-02-03 DIAGNOSIS — M25512 Pain in left shoulder: Secondary | ICD-10-CM | POA: Diagnosis not present

## 2018-02-13 DIAGNOSIS — M25512 Pain in left shoulder: Secondary | ICD-10-CM | POA: Diagnosis not present

## 2018-02-21 DIAGNOSIS — M545 Low back pain: Secondary | ICD-10-CM | POA: Diagnosis not present

## 2018-02-27 DIAGNOSIS — M47816 Spondylosis without myelopathy or radiculopathy, lumbar region: Secondary | ICD-10-CM | POA: Diagnosis not present

## 2018-03-06 DIAGNOSIS — M545 Low back pain: Secondary | ICD-10-CM | POA: Diagnosis not present

## 2018-03-08 DIAGNOSIS — M48061 Spinal stenosis, lumbar region without neurogenic claudication: Secondary | ICD-10-CM | POA: Diagnosis not present

## 2018-03-14 DIAGNOSIS — J0100 Acute maxillary sinusitis, unspecified: Secondary | ICD-10-CM | POA: Diagnosis not present

## 2018-03-23 DIAGNOSIS — J0100 Acute maxillary sinusitis, unspecified: Secondary | ICD-10-CM | POA: Diagnosis not present

## 2018-03-23 DIAGNOSIS — J209 Acute bronchitis, unspecified: Secondary | ICD-10-CM | POA: Diagnosis not present

## 2018-03-26 DIAGNOSIS — J309 Allergic rhinitis, unspecified: Secondary | ICD-10-CM | POA: Diagnosis not present

## 2018-07-12 DIAGNOSIS — E039 Hypothyroidism, unspecified: Secondary | ICD-10-CM | POA: Diagnosis not present

## 2018-07-12 DIAGNOSIS — E785 Hyperlipidemia, unspecified: Secondary | ICD-10-CM | POA: Diagnosis not present

## 2018-07-12 DIAGNOSIS — Z0000 Encounter for general adult medical examination without abnormal findings: Secondary | ICD-10-CM | POA: Diagnosis not present

## 2018-07-12 DIAGNOSIS — R739 Hyperglycemia, unspecified: Secondary | ICD-10-CM | POA: Diagnosis not present

## 2018-07-12 DIAGNOSIS — I10 Essential (primary) hypertension: Secondary | ICD-10-CM | POA: Diagnosis not present

## 2018-07-20 DIAGNOSIS — I10 Essential (primary) hypertension: Secondary | ICD-10-CM | POA: Diagnosis not present

## 2018-07-20 DIAGNOSIS — K219 Gastro-esophageal reflux disease without esophagitis: Secondary | ICD-10-CM | POA: Diagnosis not present

## 2018-07-20 DIAGNOSIS — E7800 Pure hypercholesterolemia, unspecified: Secondary | ICD-10-CM | POA: Diagnosis not present

## 2018-07-20 DIAGNOSIS — K449 Diaphragmatic hernia without obstruction or gangrene: Secondary | ICD-10-CM | POA: Diagnosis not present

## 2018-07-20 DIAGNOSIS — Z0000 Encounter for general adult medical examination without abnormal findings: Secondary | ICD-10-CM | POA: Diagnosis not present

## 2018-08-01 DIAGNOSIS — M4807 Spinal stenosis, lumbosacral region: Secondary | ICD-10-CM | POA: Diagnosis not present

## 2018-08-09 ENCOUNTER — Other Ambulatory Visit: Payer: Self-pay | Admitting: Orthopedic Surgery

## 2018-08-16 DIAGNOSIS — R1311 Dysphagia, oral phase: Secondary | ICD-10-CM | POA: Diagnosis not present

## 2018-08-18 ENCOUNTER — Other Ambulatory Visit: Payer: Self-pay | Admitting: Cardiology

## 2018-09-05 DIAGNOSIS — M4807 Spinal stenosis, lumbosacral region: Secondary | ICD-10-CM | POA: Diagnosis not present

## 2018-09-06 NOTE — Pre-Procedure Instructions (Signed)
[place] [place] [id number]  [place] [id number] Phone: [phone] Fax: [phone]      Your procedure is scheduled on Wednesday, September 9th.  Report to [facility] Main Entrance "A" at 10:00 A.M., and check in at the Admitting office.  Call this number if you have problems the morning of surgery:  [phone]  Call [phone] if you have any questions prior to your surgery date Monday-Friday 8am-4pm    Remember:  Do not eat after midnight the night before your surgery  You may drink clear liquids until 10:00 the morning of your surgery.   Clear liquids allowed are: Water, Non-Citrus Juices (without pulp), Carbonated Beverages, Clear Tea, Black Coffee Only, and Gatorade.    Enhanced Recovery after Surgery for Orthopedics Enhanced Recovery after Surgery is a protocol used to improve the stress on your body and your recovery after surgery.  Patient Instructions  . The night before surgery:  o No food after midnight. ONLY clear liquids after midnight  .  Marland Kitchen The day of surgery (if you do NOT have diabetes):  o Drink ONE (1) Pre-Surgery Clear Ensure as directed.   o This drink was given to you during your hospital  pre-op appointment visit. o The pre-op nurse will instruct you on the time to drink the  Pre-Surgery Ensure depending on your surgery time. o Finish the drink at the designated time by the pre-op nurse.  o Nothing else to drink after completing the  Pre-Surgery Clear Ensure.          If you have questions, please contact your surgeon's office.     Take these medicines the morning of surgery with A SIP OF WATER:  amLODipine (NORVASC)  Follow your surgeon's instructions on when to stop Aspirin.  If no instructions were given by your surgeon then you will need to call the office to get those instructions.    As of today, STOP taking any Aspirin (unless otherwise instructed by your surgeon), Aleve,  Naproxen, Ibuprofen, Motrin, Advil, Goody's, BC's, all herbal medications, fish oil, and all vitamins.    The Morning of Surgery  Do not wear jewelry.  Do not wear lotions, powders, or colognes, or deodorant  Men may shave face and neck.  Do not bring valuables to the hospital.  [facility] is not responsible for any belongings or valuables.  If you are a smoker, DO NOT Smoke 24 hours prior to surgery IF you wear a CPAP at night please bring your mask, tubing, and machine the morning of surgery   Remember that you must have someone to transport you home after your surgery, and remain with you for 24 hours if you are discharged the same day.   Contacts, glasses, hearing aids, dentures or bridgework may not be worn into surgery.    Leave your suitcase in the car.  After surgery it may be brought to your room.  For patients admitted to the hospital, discharge time will be determined by your treatment team.  Patients discharged the day of surgery will not be allowed to drive home.    Special instructions:   [place]- Preparing For Surgery  Before surgery, you can play an important role. Because skin is not sterile, your skin needs to be as free of germs as possible. You can reduce the number of germs on your skin by washing with CHG (chlorahexidine gluconate) Soap before surgery.  CHG is an antiseptic  cleaner which kills germs and bonds with the skin to continue killing germs even after washing.    Oral Hygiene is also important to reduce your risk of infection.  Remember - BRUSH YOUR TEETH THE MORNING OF SURGERY WITH YOUR REGULAR TOOTHPASTE  Please do not use if you have an allergy to CHG or antibacterial soaps. If your skin becomes reddened/irritated stop using the CHG.  Do not shave (including legs and underarms) for at least 48 hours prior to first CHG shower. It is OK to shave your face.  Please follow these instructions carefully.   1. Shower the NIGHT BEFORE SURGERY and  the MORNING OF SURGERY with CHG Soap.   2. If you chose to wash your hair, wash your hair first as usual with your normal shampoo.  3. After you shampoo, rinse your hair and body thoroughly to remove the shampoo.  4. Use CHG as you would any other liquid soap. You can apply CHG directly to the skin and wash gently with a scrungie or a clean washcloth.   5. Apply the CHG Soap to your body ONLY FROM THE NECK DOWN.  Do not use on open wounds or open sores. Avoid contact with your eyes, ears, mouth and genitals (private parts). Wash Face and genitals (private parts)  with your normal soap.   6. Wash thoroughly, paying special attention to the area where your surgery will be performed.  7. Thoroughly rinse your body with warm water from the neck down.  8. DO NOT shower/wash with your normal soap after using and rinsing off the CHG Soap.  9. Pat yourself dry with a CLEAN TOWEL.  10. Wear CLEAN PAJAMAS to bed the night before surgery, wear comfortable clothes the morning of surgery  11. Place CLEAN SHEETS on your bed the night of your first shower and DO NOT SLEEP WITH PETS.    Day of Surgery:  Do not apply any deodorants/lotions. Please shower the morning of surgery with the CHG soap  Please wear clean clothes to the hospital/surgery center.   Remember to brush your teeth WITH YOUR REGULAR TOOTHPASTE.   Please read over the following fact sheets that you were given.

## 2018-09-07 ENCOUNTER — Other Ambulatory Visit: Payer: Self-pay

## 2018-09-07 ENCOUNTER — Encounter (HOSPITAL_COMMUNITY)
Admission: RE | Admit: 2018-09-07 | Discharge: 2018-09-07 | Disposition: A | Discharge status: Home / Self Care | Payer: Medicare Other | Source: Ambulatory Visit | Attending: Orthopedic Surgery | Admitting: Orthopedic Surgery

## 2018-09-07 ENCOUNTER — Encounter (HOSPITAL_COMMUNITY): Payer: Self-pay

## 2018-09-07 DIAGNOSIS — Z20828 Contact with and (suspected) exposure to other viral communicable diseases: Secondary | ICD-10-CM | POA: Diagnosis not present

## 2018-09-07 DIAGNOSIS — Z01818 Encounter for other preprocedural examination: Secondary | ICD-10-CM | POA: Insufficient documentation

## 2018-09-07 DIAGNOSIS — R9431 Abnormal electrocardiogram [ECG] [EKG]: Secondary | ICD-10-CM | POA: Diagnosis not present

## 2018-09-07 DIAGNOSIS — M48061 Spinal stenosis, lumbar region without neurogenic claudication: Secondary | ICD-10-CM | POA: Insufficient documentation

## 2018-09-07 DIAGNOSIS — M4316 Spondylolisthesis, lumbar region: Secondary | ICD-10-CM | POA: Insufficient documentation

## 2018-09-07 HISTORY — DX: Essential (primary) hypertension: I10

## 2018-09-07 LAB — COMPREHENSIVE METABOLIC PANEL
ALT: 25 U/L (ref 0–44)
AST: 22 U/L (ref 15–41)
Albumin: 4.3 g/dL (ref 3.5–5.0)
Alkaline Phosphatase: 73 U/L (ref 38–126)
Anion gap: 11 (ref 5–15)
BUN: 15 mg/dL (ref 8–23)
CO2: 26 mmol/L (ref 22–32)
Calcium: 9.5 mg/dL (ref 8.9–10.3)
Chloride: 103 mmol/L (ref 98–111)
Creatinine, Ser: 0.86 mg/dL (ref 0.61–1.24)
GFR calc Af Amer: >60 mL/min (ref >60)
GFR calc non Af Amer: >60 mL/min (ref >60)
Glucose, Bld: 123 mg/dL — ABNORMAL HIGH (ref 70–99)
Potassium: 3.9 mmol/L (ref 3.5–5.1)
Sodium: 140 mmol/L (ref 135–145)
Total Bilirubin: 0.6 mg/dL (ref 0.3–1.2)
Total Protein: 7.2 g/dL (ref 6.5–8.1)

## 2018-09-07 LAB — URINALYSIS, ROUTINE W REFLEX MICROSCOPIC
Bilirubin Urine: NEGATIVE
Glucose, UA: NEGATIVE mg/dL
Hgb urine dipstick: NEGATIVE
Ketones, ur: NEGATIVE mg/dL
Leukocytes,Ua: NEGATIVE
Nitrite: NEGATIVE
Protein, ur: NEGATIVE mg/dL
Specific Gravity, Urine: 1.023 (ref 1.005–1.030)
pH: 8 (ref 5.0–8.0)

## 2018-09-07 LAB — ABO/RH: ABO/RH(D): O POS

## 2018-09-07 LAB — CBC WITH DIFFERENTIAL/PLATELET
Abs Immature Granulocytes: 0.03 10*3/uL (ref 0.00–0.07)
Basophils Absolute: 0 10*3/uL (ref 0.0–0.1)
Basophils Relative: 0 %
Eosinophils Absolute: 0 10*3/uL (ref 0.0–0.5)
Eosinophils Relative: 1 %
HCT: 46.8 % (ref 39.0–52.0)
Hemoglobin: 15.3 g/dL (ref 13.0–17.0)
Immature Granulocytes: 1 %
Lymphocytes Relative: 33 %
Lymphs Abs: 1.9 10*3/uL (ref 0.7–4.0)
MCH: 29.5 pg (ref 26.0–34.0)
MCHC: 32.7 g/dL (ref 30.0–36.0)
MCV: 90.2 fL (ref 80.0–100.0)
Monocytes Absolute: 0.5 10*3/uL (ref 0.1–1.0)
Monocytes Relative: 8 %
Neutro Abs: 3.4 10*3/uL (ref 1.7–7.7)
Neutrophils Relative %: 57 %
Platelets: 201 10*3/uL (ref 150–400)
RBC: 5.19 MIL/uL (ref 4.22–5.81)
RDW: 13.1 % (ref 11.5–15.5)
WBC: 5.9 10*3/uL (ref 4.0–10.5)
nRBC: 0 % (ref 0.0–0.2)

## 2018-09-07 LAB — TYPE AND SCREEN
ABO/RH(D): O POS
Antibody Screen: NEGATIVE

## 2018-09-07 LAB — SURGICAL PCR SCREEN
MRSA, PCR: NEGATIVE
Staphylococcus aureus: NEGATIVE

## 2018-09-07 LAB — PROTIME-INR
INR: 1.1 (ref 0.8–1.2)
Prothrombin Time: 13.7 seconds (ref 11.4–15.2)

## 2018-09-07 LAB — APTT: aPTT: 30 seconds (ref 24–36)

## 2018-09-07 NOTE — Progress Notes (Signed)
PCP - Dr. Mady Gemma  Cardiologist - Dr. Einar Gip  Chest x-ray - Denies  EKG - 09/07/2018  Stress Test - Denies  ECHO - Denies  Cardiac Cath - Denies  AICD-na PM-na LOOP-na  Sleep Study - Denies CPAP - None  LABS- 09/07/2018: CBC, CMP, PT, PTT, T/S, PCR, UA 09/09/2018: COVID  ASA- LD-8/29  ERAS- Yes- I drink given  Anesthesia- Yes- EKG  Pt denies having chest pain, sob, or fever at this time. All instructions explained to the pt, with a verbal understanding of the material. Pt agrees to go over the instructions while at home for a better understanding. Pt also instructed to self quarantine after being tested for COVID-19. The opportunity to ask questions was provided.   Coronavirus Screening  Have you experienced the following symptoms:  Cough yes/no: No Fever (>100.1F)  yes/no: No Runny nose yes/no: No Sore throat yes/no: No Difficulty breathing/shortness of breath  yes/no: No  Have you or a family member traveled in the last 14 days and where? yes/no: No   If the patient indicates "YES" to the above questions, their PAT will be rescheduled to limit the exposure to others and, the surgeon will be notified. THE PATIENT WILL NEED TO BE ASYMPTOMATIC FOR 14 DAYS.   If the patient is not experiencing any of these symptoms, the PAT nurse will instruct them to NOT bring anyone with them to their appointment since they may have these symptoms or traveled as well.   Please remind your patients and families that hospital visitation restrictions are in effect and the importance of the restrictions.

## 2018-09-08 NOTE — Anesthesia Preprocedure Evaluation (Addendum)
Anesthesia Evaluation  Patient identified by MRN, date of birth, ID band Patient awake    Reviewed: Allergy & Precautions, NPO status , Patient's Chart, lab work & pertinent test results  Airway Mallampati: II  TM Distance: >3 FB Neck ROM: Full    Dental no notable dental hx.    Pulmonary neg pulmonary ROS,    Pulmonary exam normal breath sounds clear to auscultation       Cardiovascular hypertension, Pt. on medications Normal cardiovascular exam Rhythm:Regular Rate:Normal     Neuro/Psych negative neurological ROS  negative psych ROS   GI/Hepatic negative GI ROS, Neg liver ROS,   Endo/Other  negative endocrine ROS  Renal/GU negative Renal ROS  negative genitourinary   Musculoskeletal negative musculoskeletal ROS (+)   Abdominal   Peds negative pediatric ROS (+)  Hematology negative hematology ROS (+)   Anesthesia Other Findings   Reproductive/Obstetrics negative OB ROS                            Anesthesia Physical Anesthesia Plan  ASA: II  Anesthesia Plan: General   Post-op Pain Management:    Induction: Intravenous  PONV Risk Score and Plan: 2 and Ondansetron, Dexamethasone and Treatment may vary due to age or medical condition  Airway Management Planned: Oral ETT  Additional Equipment:   Intra-op Plan:   Post-operative Plan: Extubation in OR  Informed Consent: I have reviewed the patients History and Physical, chart, labs and discussed the procedure including the risks, benefits and alternatives for the proposed anesthesia with the patient or authorized representative who has indicated his/her understanding and acceptance.     Dental advisory given  Plan Discussed with: CRNA and Surgeon  Anesthesia Plan Comments: (Previously seen yearly by Dr. Einar Gip for management of HTN. Last OV note 08/25/17 states "Caucasian male with a history of hypertension, who I had seen in  2018 due to right upper arm pain and tingling and numbness in his face and also some chest discomfort, had a negative routine treadmill stress test.  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 from a cardiac standpoint he remained stable, he has not had any recurrence of chest pain.  Treadmill stress test from year ago 2018 was normal.  I will see him back on a prn basis.""  Treadmill stress test 03/20/2013 (Dr. Einar Gip states this test was done in 2018, documentation elsewhere in note lists 2015): Normal.  Indications: Screening for CAD. Conclusions: Negative for ischemia.  Normal exercise tolerance.  Normal stress test.  Continue primary prevention.)      Anesthesia Quick Evaluation

## 2018-09-09 ENCOUNTER — Other Ambulatory Visit (HOSPITAL_COMMUNITY)
Admission: RE | Admit: 2018-09-09 | Discharge: 2018-09-09 | Disposition: A | Discharge status: Home / Self Care | Payer: Medicare Other | Source: Ambulatory Visit | Attending: Orthopedic Surgery | Admitting: Orthopedic Surgery

## 2018-09-09 DIAGNOSIS — R9431 Abnormal electrocardiogram [ECG] [EKG]: Secondary | ICD-10-CM | POA: Diagnosis not present

## 2018-09-09 DIAGNOSIS — Z01818 Encounter for other preprocedural examination: Secondary | ICD-10-CM | POA: Diagnosis not present

## 2018-09-09 DIAGNOSIS — M48061 Spinal stenosis, lumbar region without neurogenic claudication: Secondary | ICD-10-CM | POA: Diagnosis not present

## 2018-09-09 DIAGNOSIS — M4316 Spondylolisthesis, lumbar region: Secondary | ICD-10-CM | POA: Diagnosis not present

## 2018-09-10 LAB — NOVEL CORONAVIRUS, NAA (HOSP ORDER, SEND-OUT TO REF LAB; TAT 18-24 HRS): SARS-CoV-2, NAA: NOT DETECTED

## 2018-09-12 ENCOUNTER — Other Ambulatory Visit (HOSPITAL_COMMUNITY): Payer: Self-pay

## 2018-09-12 MED ORDER — CEFAZOLIN SODIUM-DEXTROSE 2-4 GM/100ML-% IV SOLN
2.0000 g | INTRAVENOUS | Status: AC
  Administered 2018-09-13: 2 g via INTRAVENOUS
  Filled 2018-09-12 (×2): qty 100

## 2018-09-13 ENCOUNTER — Encounter (HOSPITAL_COMMUNITY)
Admission: RE | Disposition: A | Discharge status: Home / Self Care | Payer: Self-pay | Source: Home / Self Care | Attending: Orthopedic Surgery

## 2018-09-13 ENCOUNTER — Inpatient Hospital Stay (HOSPITAL_COMMUNITY): Payer: Medicare Other

## 2018-09-13 ENCOUNTER — Inpatient Hospital Stay (HOSPITAL_COMMUNITY): Payer: Medicare Other | Admitting: Certified Registered Nurse Anesthetist

## 2018-09-13 ENCOUNTER — Inpatient Hospital Stay (HOSPITAL_COMMUNITY): Payer: Medicare Other | Admitting: Physician Assistant

## 2018-09-13 ENCOUNTER — Other Ambulatory Visit: Payer: Self-pay

## 2018-09-13 ENCOUNTER — Encounter (HOSPITAL_COMMUNITY): Payer: Self-pay

## 2018-09-13 ENCOUNTER — Inpatient Hospital Stay (HOSPITAL_COMMUNITY)
Admission: RE | Admit: 2018-09-13 | Discharge: 2018-09-14 | DRG: 455 | Disposition: A | Discharge status: Home / Self Care | Payer: Medicare Other | Attending: Orthopedic Surgery | Admitting: Orthopedic Surgery

## 2018-09-13 DIAGNOSIS — Z7982 Long term (current) use of aspirin: Secondary | ICD-10-CM | POA: Diagnosis not present

## 2018-09-13 DIAGNOSIS — M4316 Spondylolisthesis, lumbar region: Secondary | ICD-10-CM | POA: Diagnosis not present

## 2018-09-13 DIAGNOSIS — M8938 Hypertrophy of bone, other site: Secondary | ICD-10-CM | POA: Diagnosis not present

## 2018-09-13 DIAGNOSIS — Z79899 Other long term (current) drug therapy: Secondary | ICD-10-CM

## 2018-09-13 DIAGNOSIS — Z981 Arthrodesis status: Secondary | ICD-10-CM | POA: Diagnosis not present

## 2018-09-13 DIAGNOSIS — M4326 Fusion of spine, lumbar region: Secondary | ICD-10-CM | POA: Diagnosis not present

## 2018-09-13 DIAGNOSIS — M48061 Spinal stenosis, lumbar region without neurogenic claudication: Secondary | ICD-10-CM | POA: Diagnosis not present

## 2018-09-13 DIAGNOSIS — M48062 Spinal stenosis, lumbar region with neurogenic claudication: Secondary | ICD-10-CM | POA: Diagnosis not present

## 2018-09-13 DIAGNOSIS — I10 Essential (primary) hypertension: Secondary | ICD-10-CM | POA: Diagnosis present

## 2018-09-13 DIAGNOSIS — M5410 Radiculopathy, site unspecified: Secondary | ICD-10-CM | POA: Diagnosis present

## 2018-09-13 DIAGNOSIS — Z888 Allergy status to other drugs, medicaments and biological substances status: Secondary | ICD-10-CM | POA: Diagnosis not present

## 2018-09-13 DIAGNOSIS — M5416 Radiculopathy, lumbar region: Secondary | ICD-10-CM | POA: Diagnosis not present

## 2018-09-13 DIAGNOSIS — Z419 Encounter for procedure for purposes other than remedying health state, unspecified: Secondary | ICD-10-CM

## 2018-09-13 HISTORY — PX: TRANSFORAMINAL LUMBAR INTERBODY FUSION (TLIF) WITH PEDICLE SCREW FIXATION 2 LEVEL: SHX6142

## 2018-09-13 SURGERY — TRANSFORAMINAL LUMBAR INTERBODY FUSION (TLIF) WITH PEDICLE SCREW FIXATION 2 LEVEL
Anesthesia: General | Site: Spine Lumbar | Laterality: Left

## 2018-09-13 MED ORDER — FENTANYL CITRATE (PF) 250 MCG/5ML IJ SOLN
INTRAMUSCULAR | Status: AC
  Filled 2018-09-13: qty 5

## 2018-09-13 MED ORDER — DIAZEPAM 5 MG PO TABS
5.0000 mg | ORAL_TABLET | Freq: Four times a day (QID) | ORAL | Status: DC | PRN
  Administered 2018-09-13 – 2018-09-14 (×2): 5 mg via ORAL
  Filled 2018-09-13: qty 1

## 2018-09-13 MED ORDER — LIDOCAINE 2% (20 MG/ML) 5 ML SYRINGE
INTRAMUSCULAR | Status: DC | PRN
  Administered 2018-09-13: 100 mg via INTRAVENOUS

## 2018-09-13 MED ORDER — DEXAMETHASONE SODIUM PHOSPHATE 10 MG/ML IJ SOLN
INTRAMUSCULAR | Status: DC | PRN
  Administered 2018-09-13: 10 mg via INTRAVENOUS

## 2018-09-13 MED ORDER — PHENOL 1.4 % MT LIQD: 1.0000 | OROMUCOSAL | Status: DC | PRN

## 2018-09-13 MED ORDER — DEXAMETHASONE SODIUM PHOSPHATE 10 MG/ML IJ SOLN
INTRAMUSCULAR | Status: AC
  Filled 2018-09-13: qty 2

## 2018-09-13 MED ORDER — IRBESARTAN 300 MG PO TABS
300.0000 mg | ORAL_TABLET | Freq: Every day | ORAL | Status: DC
  Administered 2018-09-14: 09:00:00 300 mg via ORAL
  Filled 2018-09-13 (×2): qty 1

## 2018-09-13 MED ORDER — MIDAZOLAM HCL 2 MG/2ML IJ SOLN
INTRAMUSCULAR | Status: DC | PRN
  Administered 2018-09-13: 2 mg via INTRAVENOUS

## 2018-09-13 MED ORDER — METHYLENE BLUE 0.5 % INJ SOLN
INTRAVENOUS | Status: DC | PRN
  Administered 2018-09-13: 1 mL

## 2018-09-13 MED ORDER — MENTHOL 3 MG MT LOZG: 1.0000 | LOZENGE | OROMUCOSAL | Status: DC | PRN

## 2018-09-13 MED ORDER — DOCUSATE SODIUM 100 MG PO CAPS
100.0000 mg | ORAL_CAPSULE | Freq: Two times a day (BID) | ORAL | Status: DC
  Administered 2018-09-13 – 2018-09-14 (×2): 100 mg via ORAL
  Filled 2018-09-13 (×2): qty 1

## 2018-09-13 MED ORDER — AMLODIPINE BESYLATE 5 MG PO TABS
5.0000 mg | ORAL_TABLET | Freq: Every day | ORAL | Status: DC
  Administered 2018-09-13 – 2018-09-14 (×2): 5 mg via ORAL
  Filled 2018-09-13 (×2): qty 1

## 2018-09-13 MED ORDER — DIAZEPAM 5 MG PO TABS
ORAL_TABLET | ORAL | Status: AC
  Administered 2018-09-13: 5 mg via ORAL
  Filled 2018-09-13: qty 1

## 2018-09-13 MED ORDER — HEMOSTATIC AGENTS (NO CHARGE) OPTIME
TOPICAL | Status: DC | PRN
  Administered 2018-09-13: 1 via TOPICAL

## 2018-09-13 MED ORDER — ACETAMINOPHEN 10 MG/ML IV SOLN
INTRAVENOUS | Status: AC
  Filled 2018-09-13: qty 100

## 2018-09-13 MED ORDER — EPHEDRINE SULFATE 50 MG/ML IJ SOLN
INTRAMUSCULAR | Status: DC | PRN
  Administered 2018-09-13: 10 mg via INTRAVENOUS

## 2018-09-13 MED ORDER — BUPIVACAINE-EPINEPHRINE 0.25% -1:200000 IJ SOLN
INTRAMUSCULAR | Status: DC | PRN
  Administered 2018-09-13: 20 mL

## 2018-09-13 MED ORDER — SODIUM CHLORIDE 0.9% FLUSH: 3.0000 mL | INTRAVENOUS | Status: DC | PRN

## 2018-09-13 MED ORDER — PROMETHAZINE HCL 25 MG/ML IJ SOLN: 6.2500 mg | INTRAMUSCULAR | Status: DC | PRN

## 2018-09-13 MED ORDER — FLEET ENEMA 7-19 GM/118ML RE ENEM: 1.0000 | ENEMA | Freq: Once | RECTAL | Status: DC | PRN

## 2018-09-13 MED ORDER — BUPIVACAINE LIPOSOME 1.3 % IJ SUSP
INTRAMUSCULAR | Status: DC | PRN
  Administered 2018-09-13: 20 mL

## 2018-09-13 MED ORDER — MORPHINE SULFATE (PF) 2 MG/ML IV SOLN: 1.0000 mg | INTRAVENOUS | Status: DC | PRN

## 2018-09-13 MED ORDER — BISACODYL 5 MG PO TBEC: 5.0000 mg | DELAYED_RELEASE_TABLET | Freq: Every day | ORAL | Status: DC | PRN

## 2018-09-13 MED ORDER — LIDOCAINE 2% (20 MG/ML) 5 ML SYRINGE
INTRAMUSCULAR | Status: AC
  Filled 2018-09-13: qty 10

## 2018-09-13 MED ORDER — SODIUM CHLORIDE 0.9 % IV SOLN: 250.0000 mL | INTRAVENOUS | Status: DC

## 2018-09-13 MED ORDER — MIDAZOLAM HCL 2 MG/2ML IJ SOLN
INTRAMUSCULAR | Status: AC
  Filled 2018-09-13: qty 2

## 2018-09-13 MED ORDER — ONDANSETRON HCL 4 MG PO TABS: 4.0000 mg | ORAL_TABLET | Freq: Four times a day (QID) | ORAL | Status: DC | PRN

## 2018-09-13 MED ORDER — METHYLENE BLUE 0.5 % INJ SOLN
INTRAVENOUS | Status: AC
  Filled 2018-09-13: qty 10

## 2018-09-13 MED ORDER — ONDANSETRON HCL 4 MG/2ML IJ SOLN
INTRAMUSCULAR | Status: DC | PRN
  Administered 2018-09-13: 4 mg via INTRAVENOUS

## 2018-09-13 MED ORDER — LACTATED RINGERS IV SOLN
INTRAVENOUS | Status: DC
  Administered 2018-09-13 (×2): via INTRAVENOUS

## 2018-09-13 MED ORDER — BUPIVACAINE-EPINEPHRINE (PF) 0.25% -1:200000 IJ SOLN
INTRAMUSCULAR | Status: AC
  Filled 2018-09-13: qty 30

## 2018-09-13 MED ORDER — STERILE WATER FOR IRRIGATION IR SOLN
Status: DC | PRN
  Administered 2018-09-13: 1000 mL

## 2018-09-13 MED ORDER — FENTANYL CITRATE (PF) 250 MCG/5ML IJ SOLN
INTRAMUSCULAR | Status: DC | PRN
  Administered 2018-09-13 (×2): 25 ug via INTRAVENOUS
  Administered 2018-09-13: 50 ug via INTRAVENOUS
  Administered 2018-09-13: 25 ug via INTRAVENOUS
  Administered 2018-09-13: 50 ug via INTRAVENOUS
  Administered 2018-09-13 (×5): 25 ug via INTRAVENOUS

## 2018-09-13 MED ORDER — SODIUM CHLORIDE 0.9% FLUSH: 3.0000 mL | Freq: Two times a day (BID) | INTRAVENOUS | Status: DC

## 2018-09-13 MED ORDER — ACETAMINOPHEN 10 MG/ML IV SOLN
INTRAVENOUS | Status: DC | PRN
  Administered 2018-09-13: 1000 mg via INTRAVENOUS

## 2018-09-13 MED ORDER — BUPIVACAINE LIPOSOME 1.3 % IJ SUSP
20.0000 mL | Freq: Once | INTRAMUSCULAR | Status: DC
  Filled 2018-09-13: qty 20

## 2018-09-13 MED ORDER — ALUM & MAG HYDROXIDE-SIMETH 200-200-20 MG/5ML PO SUSP: 30.0000 mL | Freq: Four times a day (QID) | ORAL | Status: DC | PRN

## 2018-09-13 MED ORDER — ZOLPIDEM TARTRATE 5 MG PO TABS: 5.0000 mg | ORAL_TABLET | Freq: Every evening | ORAL | Status: DC | PRN

## 2018-09-13 MED ORDER — SODIUM CHLORIDE 0.9 % IV SOLN
INTRAVENOUS | Status: DC | PRN
  Administered 2018-09-13: 25 ug/min via INTRAVENOUS

## 2018-09-13 MED ORDER — ROCURONIUM BROMIDE 10 MG/ML (PF) SYRINGE
PREFILLED_SYRINGE | INTRAVENOUS | Status: AC
  Filled 2018-09-13: qty 20

## 2018-09-13 MED ORDER — 0.9 % SODIUM CHLORIDE (POUR BTL) OPTIME
TOPICAL | Status: DC | PRN
  Administered 2018-09-13 (×3): 1000 mL

## 2018-09-13 MED ORDER — SENNOSIDES-DOCUSATE SODIUM 8.6-50 MG PO TABS: 1.0000 | ORAL_TABLET | Freq: Every evening | ORAL | Status: DC | PRN

## 2018-09-13 MED ORDER — THROMBIN (RECOMBINANT) 20000 UNITS EX SOLR
CUTANEOUS | Status: AC
  Filled 2018-09-13: qty 20000

## 2018-09-13 MED ORDER — PROPOFOL 10 MG/ML IV BOLUS
INTRAVENOUS | Status: DC | PRN
  Administered 2018-09-13: 150 mg via INTRAVENOUS

## 2018-09-13 MED ORDER — OXYCODONE-ACETAMINOPHEN 5-325 MG PO TABS
1.0000 | ORAL_TABLET | ORAL | Status: DC | PRN
  Administered 2018-09-13: 21:00:00 2 via ORAL
  Administered 2018-09-14: 01:00:00 1 via ORAL
  Administered 2018-09-14 (×2): 2 via ORAL
  Filled 2018-09-13 (×4): qty 2

## 2018-09-13 MED ORDER — PROPOFOL 500 MG/50ML IV EMUL
INTRAVENOUS | Status: DC | PRN
  Administered 2018-09-13: 25 ug/kg/min via INTRAVENOUS

## 2018-09-13 MED ORDER — ACETAMINOPHEN 325 MG PO TABS: 650.0000 mg | ORAL_TABLET | ORAL | Status: DC | PRN

## 2018-09-13 MED ORDER — CEFAZOLIN SODIUM-DEXTROSE 2-4 GM/100ML-% IV SOLN
2.0000 g | Freq: Three times a day (TID) | INTRAVENOUS | Status: AC
  Administered 2018-09-13 – 2018-09-14 (×2): 2 g via INTRAVENOUS
  Filled 2018-09-13 (×2): qty 100

## 2018-09-13 MED ORDER — HYDROMORPHONE HCL 1 MG/ML IJ SOLN
INTRAMUSCULAR | Status: AC
  Administered 2018-09-13: 19:00:00 0.5 mg via INTRAVENOUS
  Filled 2018-09-13: qty 1

## 2018-09-13 MED ORDER — POVIDONE-IODINE 7.5 % EX SOLN
Freq: Once | CUTANEOUS | Status: DC
  Filled 2018-09-13: qty 118

## 2018-09-13 MED ORDER — ONDANSETRON HCL 4 MG/2ML IJ SOLN
INTRAMUSCULAR | Status: AC
  Filled 2018-09-13: qty 4

## 2018-09-13 MED ORDER — ONDANSETRON HCL 4 MG/2ML IJ SOLN: 4.0000 mg | Freq: Four times a day (QID) | INTRAMUSCULAR | Status: DC | PRN

## 2018-09-13 MED ORDER — ACETAMINOPHEN 650 MG RE SUPP: 650.0000 mg | RECTAL | Status: DC | PRN

## 2018-09-13 MED ORDER — THROMBIN 20000 UNITS EX SOLR
CUTANEOUS | Status: DC | PRN
  Administered 2018-09-13: 20000 [IU] via TOPICAL

## 2018-09-13 MED ORDER — POTASSIUM CHLORIDE IN NACL 20-0.9 MEQ/L-% IV SOLN: INTRAVENOUS | Status: DC

## 2018-09-13 MED ORDER — PROPOFOL 10 MG/ML IV BOLUS
INTRAVENOUS | Status: AC
  Filled 2018-09-13: qty 20

## 2018-09-13 MED ORDER — HYDROMORPHONE HCL 1 MG/ML IJ SOLN
INTRAMUSCULAR | Status: AC
  Administered 2018-09-13: 18:00:00 0.5 mg via INTRAVENOUS
  Filled 2018-09-13: qty 1

## 2018-09-13 MED ORDER — HYDROMORPHONE HCL 1 MG/ML IJ SOLN
0.2500 mg | INTRAMUSCULAR | Status: DC | PRN
  Administered 2018-09-13 (×4): 0.5 mg via INTRAVENOUS

## 2018-09-13 MED ORDER — ROCURONIUM BROMIDE 10 MG/ML (PF) SYRINGE
PREFILLED_SYRINGE | INTRAVENOUS | Status: DC | PRN
  Administered 2018-09-13: 60 mg via INTRAVENOUS
  Administered 2018-09-13: 10 mg via INTRAVENOUS
  Administered 2018-09-13 (×2): 20 mg via INTRAVENOUS

## 2018-09-13 SURGICAL SUPPLY — 94 items
AGENT HMST KT MTR STRL THRMB (HEMOSTASIS)
APL SKNCLS STERI-STRIP NONHPOA (GAUZE/BANDAGES/DRESSINGS) ×1
BENZOIN TINCTURE PRP APPL 2/3 (GAUZE/BANDAGES/DRESSINGS) ×3 IMPLANT
BLADE CLIPPER SURG (BLADE) IMPLANT
BONE VIVIGEN FORMABLE 10CC (Bone Implant) ×6 IMPLANT
BUR PRESCISION 1.7 ELITE (BURR) ×3 IMPLANT
BUR ROUND FLUTED 5 RND (BURR) ×2 IMPLANT
BUR ROUND FLUTED 5MM RND (BURR) ×1
BUR ROUND PRECISION 4.0 (BURR) IMPLANT
BUR ROUND PRECISION 4.0MM (BURR)
BUR SABER RD CUTTING 3.0 (BURR) IMPLANT
BUR SABER RD CUTTING 3.0MM (BURR)
CAGE BULLET CONCORDE 9X10X27 (Cage) ×1 IMPLANT
CAGE BULLET CONCORDE 9X10X27MM (Cage) ×1 IMPLANT
CAGE CONCORDE BULLET 9X9X27 (Cage) ×2 IMPLANT
CARTRIDGE OIL MAESTRO DRILL (MISCELLANEOUS) ×1 IMPLANT
CLOSURE WOUND 1/2 X4 (GAUZE/BANDAGES/DRESSINGS) ×2
CONT SPEC 4OZ CLIKSEAL STRL BL (MISCELLANEOUS) ×3 IMPLANT
COVER MAYO STAND STRL (DRAPES) ×6 IMPLANT
COVER SURGICAL LIGHT HANDLE (MISCELLANEOUS) ×3 IMPLANT
COVER WAND RF STERILE (DRAPES) ×3 IMPLANT
DIFFUSER DRILL AIR PNEUMATIC (MISCELLANEOUS) ×3 IMPLANT
DRAIN CHANNEL 15F RND FF W/TCR (WOUND CARE) ×2 IMPLANT
DRAPE C-ARM 42X72 X-RAY (DRAPES) ×3 IMPLANT
DRAPE C-ARMOR (DRAPES) IMPLANT
DRAPE POUCH INSTRU U-SHP 10X18 (DRAPES) ×3 IMPLANT
DRAPE SURG 17X23 STRL (DRAPES) ×12 IMPLANT
DURAPREP 26ML APPLICATOR (WOUND CARE) ×3 IMPLANT
ELECT BLADE 4.0 EZ CLEAN MEGAD (MISCELLANEOUS) ×3
ELECT CAUTERY BLADE 6.4 (BLADE) ×3 IMPLANT
ELECT REM PT RETURN 9FT ADLT (ELECTROSURGICAL) ×3
ELECTRODE BLDE 4.0 EZ CLN MEGD (MISCELLANEOUS) ×1 IMPLANT
ELECTRODE REM PT RTRN 9FT ADLT (ELECTROSURGICAL) ×1 IMPLANT
EVACUATOR SILICONE 100CC (DRAIN) ×2 IMPLANT
FEE INTRAOP MONITOR IMPULS NCS (MISCELLANEOUS) IMPLANT
FILTER STRAW FLUID ASPIR (MISCELLANEOUS) ×3 IMPLANT
GAUZE 4X4 16PLY RFD (DISPOSABLE) ×3 IMPLANT
GAUZE SPONGE 4X4 12PLY STRL (GAUZE/BANDAGES/DRESSINGS) ×3 IMPLANT
GLOVE BIO SURGEON STRL SZ7 (GLOVE) ×5 IMPLANT
GLOVE BIO SURGEON STRL SZ8 (GLOVE) ×3 IMPLANT
GLOVE BIOGEL PI IND STRL 7.0 (GLOVE) ×1 IMPLANT
GLOVE BIOGEL PI IND STRL 8 (GLOVE) ×1 IMPLANT
GLOVE BIOGEL PI INDICATOR 7.0 (GLOVE) ×2
GLOVE BIOGEL PI INDICATOR 8 (GLOVE) ×2
GOWN STRL REUS W/ TWL LRG LVL3 (GOWN DISPOSABLE) ×2 IMPLANT
GOWN STRL REUS W/ TWL XL LVL3 (GOWN DISPOSABLE) ×1 IMPLANT
GOWN STRL REUS W/TWL LRG LVL3 (GOWN DISPOSABLE) ×6
GOWN STRL REUS W/TWL XL LVL3 (GOWN DISPOSABLE) ×3
GRAFT BNE MATRIX VG FRMBL L 10 (Bone Implant) IMPLANT
INTRAOP MONITOR FEE IMPULS NCS (MISCELLANEOUS) ×1
INTRAOP MONITOR FEE IMPULSE (MISCELLANEOUS) ×2
IV CATH 14GX2 1/4 (CATHETERS) ×3 IMPLANT
KIT BASIN OR (CUSTOM PROCEDURE TRAY) ×3 IMPLANT
KIT POSITION SURG JACKSON T1 (MISCELLANEOUS) ×3 IMPLANT
KIT TURNOVER KIT B (KITS) ×3 IMPLANT
MARKER SKIN DUAL TIP RULER LAB (MISCELLANEOUS) ×6 IMPLANT
NDL 18GX1X1/2 (RX/OR ONLY) (NEEDLE) ×1 IMPLANT
NDL HYPO 25GX1X1/2 BEV (NEEDLE) ×1 IMPLANT
NDL SPNL 18GX3.5 QUINCKE PK (NEEDLE) ×2 IMPLANT
NEEDLE 18GX1X1/2 (RX/OR ONLY) (NEEDLE) ×3 IMPLANT
NEEDLE 22X1 1/2 (OR ONLY) (NEEDLE) ×6 IMPLANT
NEEDLE HYPO 25GX1X1/2 BEV (NEEDLE) ×3 IMPLANT
NEEDLE SPNL 18GX3.5 QUINCKE PK (NEEDLE) ×6 IMPLANT
NS IRRIG 1000ML POUR BTL (IV SOLUTION) ×5 IMPLANT
OIL CARTRIDGE MAESTRO DRILL (MISCELLANEOUS) ×3
PACK LAMINECTOMY ORTHO (CUSTOM PROCEDURE TRAY) ×3 IMPLANT
PACK UNIVERSAL I (CUSTOM PROCEDURE TRAY) ×3 IMPLANT
PAD ARMBOARD 7.5X6 YLW CONV (MISCELLANEOUS) ×6 IMPLANT
PATTIES SURGICAL .5 X1 (DISPOSABLE) ×3 IMPLANT
PATTIES SURGICAL .5X1.5 (GAUZE/BANDAGES/DRESSINGS) ×3 IMPLANT
PROBE PEDCLE PROBE MAGSTM DISP (MISCELLANEOUS) ×2 IMPLANT
ROD EXPEDIUM PER BENT 65MM (Rod) ×4 IMPLANT
ROD EXPEDIUM PRE BENT 5.5X75 (Rod) ×2 IMPLANT
SCREW SET SINGLE INNER (Screw) ×12 IMPLANT
SCREW VIPER CORT FIX 6X35 (Screw) ×12 IMPLANT
SPONGE INTESTINAL PEANUT (DISPOSABLE) ×3 IMPLANT
SPONGE LAP 4X18 RFD (DISPOSABLE) ×2 IMPLANT
SPONGE SURGIFOAM ABS GEL 100 (HEMOSTASIS) ×3 IMPLANT
STRIP CLOSURE SKIN 1/2X4 (GAUZE/BANDAGES/DRESSINGS) ×4 IMPLANT
SURGIFLO W/THROMBIN 8M KIT (HEMOSTASIS) IMPLANT
SUT ETHILON 2 0 FS 18 (SUTURE) ×2 IMPLANT
SUT MNCRL AB 4-0 PS2 18 (SUTURE) ×3 IMPLANT
SUT VIC AB 0 CT1 18XCR BRD 8 (SUTURE) ×1 IMPLANT
SUT VIC AB 0 CT1 8-18 (SUTURE) ×3
SUT VIC AB 1 CT1 18XCR BRD 8 (SUTURE) ×1 IMPLANT
SUT VIC AB 1 CT1 8-18 (SUTURE) ×3
SUT VIC AB 2-0 CT2 18 VCP726D (SUTURE) ×3 IMPLANT
SYR 20ML LL LF (SYRINGE) ×6 IMPLANT
SYR BULB IRRIGATION 50ML (SYRINGE) ×3 IMPLANT
SYR CONTROL 10ML LL (SYRINGE) ×6 IMPLANT
SYR TB 1ML LUER SLIP (SYRINGE) ×3 IMPLANT
TRAY FOLEY MTR SLVR 16FR STAT (SET/KITS/TRAYS/PACK) ×3 IMPLANT
WATER STERILE IRR 1000ML POUR (IV SOLUTION) ×3 IMPLANT
YANKAUER SUCT BULB TIP NO VENT (SUCTIONS) ×3 IMPLANT

## 2018-09-13 NOTE — H&P (Signed)
PREOPERATIVE H&P  Chief Complaint: Low back pain, right leg numbness  HPI: Scott Mclean is a 73 y.o. male who presents with ongoing pain in the low back  MRI reveals severe stenosis at L3/4 and L4/5 with instability at those levels  Patient has failed multiple forms of conservative care and continues to have pain (see office notes for additional details regarding the patient's full course of treatment)  Past Medical History:  Diagnosis Date  . Hypertension    Past Surgical History:  Procedure Laterality Date  . APPENDECTOMY     at age 62  . CARPAL TUNNEL RELEASE     Left  . ROTATOR CUFF REPAIR     Left   Social History   Socioeconomic History  . Marital status: Married    Spouse name: Not on file  . Number of children: Not on file  . Years of education: Not on file  . Highest education level: Not on file  Occupational History  . Not on file  Social Needs  . Financial resource strain: Not on file  . Food insecurity    Worry: Not on file    Inability: Not on file  . Transportation needs    Medical: Not on file    Non-medical: Not on file  Tobacco Use  . Smoking status: Never Smoker  . Smokeless tobacco: Never Used  Substance and Sexual Activity  . Alcohol use: Not Currently  . Drug use: Never  . Sexual activity: Not on file  Lifestyle  . Physical activity    Days per week: Not on file    Minutes per session: Not on file  . Stress: Not on file  Relationships  . Social Herbalist on phone: Not on file    Gets together: Not on file    Attends religious service: Not on file    Active member of club or organization: Not on file    Attends meetings of clubs or organizations: Not on file    Relationship status: Not on file  Other Topics Concern  . Not on file  Social History Narrative  . Not on file   No family history on file. Allergies  Allergen Reactions  . Lisinopril Swelling    Tongue swelling  . Omeprazole Other (See  Comments)    Upset Stomach   Prior to Admission medications   Medication Sig Start Date End Date Taking? Authorizing Provider  amLODipine (NORVASC) 5 MG tablet Take 1 tablet by mouth once daily Patient taking differently: Take 5 mg by mouth daily.  08/18/18  Yes Adrian Prows, MD  aspirin EC 81 MG tablet Take 81 mg by mouth daily.   Yes [provider]  telmisartan (MICARDIS) 80 MG tablet Take 80 mg by mouth daily. 08/18/18  Yes [provider]  fluconazole (DIFLUCAN) 150 MG tablet Take 1 tablet (150 mg total) by mouth once a week. Patient not taking: Reported on 09/05/2018 02/07/17   Evelina Bucy, DPM     All other systems have been reviewed and were otherwise negative with the exception of those mentioned in the HPI and as above.  Physical Exam: There were no vitals filed for this visit.  There is no height or weight on file to calculate BMI.  General: Alert, no acute distress Cardiovascular: No pedal edema Respiratory: No cyanosis, no use of accessory musculature Skin: No lesions in the area of chief complaint Neurologic: Sensation intact distally Psychiatric: Patient  is competent for consent with normal mood and affect Lymphatic: No axillary or cervical lymphadenopathy  Assessment/Plan: LUMBAR 3-4 AND LUMBAR 4-5 SEVERE SPINAL STENOSIS IN COMBINATION WITH A GRADE 1 SPONDYLOLISTHESES Plan for Procedure(s): LEFT-SIDED LUMBAR 3-4, LUMBAR 4-5 TRANSFORAMINAL LUMBAR INTERBODY FUSION WITH INSTRUMENTTION AND ALLOGRAFT   Norva Karvonen, MD 09/13/2018 8:10 AM

## 2018-09-13 NOTE — Op Note (Signed)
PATIENT NAME: Scott Mclean   MEDICAL RECORD NO.:   [id number]    DATE OF BIRTH: [birth year]   DATE OF PROCEDURE: 09/13/2018                                OPERATIVE REPORT     PREOPERATIVE DIAGNOSES: 1. Severe spinal stenosis L3-4, L4-5 2. Neurogenic claudication 3. L3-4, L4-5 spondylolisthesis   POSTOPERATIVE DIAGNOSES: 1. Severe spinal stenosis L3-4, L4-5 2. Neurogenic claudication 3. L3-4, L4-5 spondylolisthesis   PROCEDURES: 1. Lumbar decompression, L3-4, L4-5, including bilateral partial facetectomy, and bilateral lumbar decompression 2. Left-sided L3-4, L4-5 transforaminal lumbar interbody fusion. 3. Right-sided L3-4, L4-5 posterolateral fusion. 4. Insertion of interbody device x 2 (Concorde intervertebral spacers). 5. Placement of segmental posterior instrumentation L3, L4, L5, bilaterally. 6. Use of local autograft. 7. Use of morselized allograft - ViviGen. 8. Intraoperative use of fluoroscopy.   SURGEON:  Phylliss Bob, MD.   ASSISTANTPricilla Holm, PA-C.   ANESTHESIA:  General endotracheal anesthesia.   COMPLICATIONS:  None.   DISPOSITION:  Stable.   ESTIMATED BLOOD LOSS:  200cc   INDICATIONS FOR SURGERY:  Briefly, Scott Mclean is a pleasant 73 y.o. -year-old patient who did present to me with severe and ongoing pain primarily in his back. I did feel that the symptoms were secondary to the findings noted above (severe spinal stenosis and instability).   The patient failed conservative care and did wish to proceed with the procedure  noted above.    OPERATIVE DETAILS:  On  09/13/2018, the patient was brought to surgery and general endotracheal anesthesia was administered.  The patient was placed prone on a well-padded flat Jackson bed with a spinal frame.  Antibiotics were given and a time-out procedure was performed. The back was prepped and draped in the usual fashion.  A midline incision was made overlying the L3-4 and L4-5 intervertebral spaces.   The fascia was incised at the midline.  The paraspinal musculature was bluntly swept laterally.  Anatomic landmarks for the pedicles were exposed. Using fluoroscopy, I did cannulate the L3, L4, and L5 pedicles bilaterally, using a medial to lateral cortical trajectory technique.  On the right side, the posterolateral gutter and facet joints at L3-4 and L4-5 were decorticated and 6 mm screws of the appropriate length were placed at L3, L4, and L5 pedicles and a 65-mm rod was placed and distraction was applied across the rod at each intervertebral level.  On the left side, the cannulated pedicle holes were filled with bone wax.  I then proceeded with the decompressive aspect of the procedure.     Starting at L4-5, I did perform a laminotomy and a full facetectomy on the left.  I was able to thoroughly and entirely decompress the L4-5 intervertebral space bilaterally, removing facet hypertrophy and ligamentum flavum hypertrophy.  At this point, with an assistant holding medial retraction of the traversing left L5 nerve, I did perform a thorough and complete L4-5 intervertebral discectomy.  The intervertebral space was then liberally packed with autograft from the decompression, as well as allograft in the form of ViviGen, as was the appropriately sized intervertebral spacer (9 mm).  Distraction was then released on the contralateral right side.  I then turned my attention to the L3-4 level.  Once again, it was clearly evident that there was severe stenosis at the L3-4 level.  The stenosis was thoroughly and adequately decompressed by performing a  bilateral partial facetectomy.  I was able to thoroughly decompress the L3/4 level. With an assistant holding medial retraction of the traversing left L4 nerve, I did perform an annulotomy at the posterolateral aspect of the L3-4 intervertebral space.  I then used a series of curettes and pituitary rongeurs to perform a thorough and complete intervertebral  diskectomy.  The intervertebral space was then liberally packed with autograft as well as allograft in the form of ViviGen, as was the appropriate-sized intervertebral spacer (10 mm).  The spacer was then tamped into position in the usual fashion.  I was very pleased with the press-fit of the spacer.  I then placed 6 mm screws on the left at L3, L4, and L5.  A 65-mm rod was then placed and caps were placed. The distraction was then released on the contralateral right side.  All 6 caps were then locked.  The wound was copiously irrigated with a total of approximately 3 L prior to placing the bone graft.  Additional autograft and allograft were then packed into the posterolateral gutter on the right side to help aid in the success of the fusion.  The wound was  explored for any undue bleeding and there was no substantial bleeding encountered.  Gel-Foam was placed over the laminectomy site.  The wound was then closed in layers using #1 Vicryl followed by 2-0 Vicryl, followed by 4-0 Monocryl.  Benzoin and Steri-Strips were applied followed by sterile dressing.     Of note, I did use triggered EMG to test the screws on the left, and there is no screw the tested below 20 mA. There was no sustained abnormal EMG activity noted throughout the entire surgery.   Of note, Pricilla Holm was my assistant throughout surgery, and did aid in retraction, suctioning, and closure.     Phylliss Bob, MD

## 2018-09-13 NOTE — Progress Notes (Signed)
Orthopedic Tech Progress Note Patient Details:  Scott Mclean 07-Feb-1945 [id number] Called in order to [facility] for a TLSO Patient ID: Scott Mclean, male   DOB: 1945/10/25, 73 y.o.   MRN: [id number]   Scott Mclean 09/13/2018, 6:52 PM

## 2018-09-13 NOTE — Anesthesia Procedure Notes (Signed)
Procedure Name: Intubation Date/Time: 09/13/2018 12:42 PM Performed by: Clearnce Sorrel, CRNA Pre-anesthesia Checklist: Patient identified, Emergency Drugs available, Suction available, Patient being monitored and Timeout performed Patient Re-evaluated:Patient Re-evaluated prior to induction Oxygen Delivery Method: Circle system utilized Preoxygenation: Pre-oxygenation with 100% oxygen Induction Type: IV induction Ventilation: Mask ventilation without difficulty Laryngoscope Size: Mac and 4 Grade View: Grade III Tube type: Oral Tube size: 7.5 mm Number of attempts: 1 Airway Equipment and Method: Stylet Placement Confirmation: positive ETCO2 and breath sounds checked- equal and bilateral Secured at: 23 cm Tube secured with: Tape Dental Injury: Teeth and Oropharynx as per pre-operative assessment

## 2018-09-13 NOTE — Progress Notes (Signed)
Pt stated he took an allergy medication this morning, DOS, at 0605.  Pt did not know name of medication, but did state that it was an allergy medication.  No other medications were taken DOS.

## 2018-09-13 NOTE — Transfer of Care (Signed)
Immediate Anesthesia Transfer of Care Note  Patient: Mendell Alyea Min  Procedure(s) Performed: LEFT-SIDED LUMBAR FOUR-FIVE, LUMBAR THREE-FOUR TRANSFORAMINAL LUMBAR INTERBODY FUSION WITH INSTRUMENTATION AND ALLOGRAFT (Left Spine Lumbar)  Patient Location: PACU  Anesthesia Type:General  Level of Consciousness: awake, alert  and oriented  Airway & Oxygen Therapy: Patient Spontanous Breathing and Patient connected to nasal cannula oxygen  Post-op Assessment: Report given to RN and Post -op Vital signs reviewed and stable  Post vital signs: Reviewed and stable  Last Vitals:  Vitals Value Taken Time  BP 100/71 09/13/18 1752  Temp    Pulse 78 09/13/18 1759  Resp 15 09/13/18 1759  SpO2 95 % 09/13/18 1759  Vitals shown include unvalidated device data.  Last Pain:  Vitals:   09/13/18 1024  TempSrc:   PainSc: 3       Patients Stated Pain Goal: 2 ([id number])  Complications: No apparent anesthesia complications

## 2018-09-14 MED ORDER — DIAZEPAM 5 MG PO TABS: 5.0000 mg | ORAL_TABLET | Freq: Four times a day (QID) | ORAL | 0 refills | Status: DC | PRN

## 2018-09-14 MED ORDER — OXYCODONE-ACETAMINOPHEN 5-325 MG PO TABS: 1.0000 | ORAL_TABLET | ORAL | 0 refills | Status: DC | PRN

## 2018-09-14 MED FILL — Thrombin (Recombinant) For Soln 20000 Unit: CUTANEOUS | Qty: 1 | Status: AC

## 2018-09-14 NOTE — Progress Notes (Signed)
    Patient doing well  Patient denies leg pain + expected minimal LBP   Physical Exam: Vitals:   09/14/18 0337 09/14/18 0726  BP: 112/63 121/66  Pulse: 83 73  Resp: 20 16  Temp: 98.4 F (36.9 C) (!) 100.9 F (38.3 C)  SpO2: 94% 95%    Dressing in place NVI  Drain output 5cc/hour over past 2 hours (8a-10a)  POD #1 s/p L3-L5 decompression and fusion, doing well  - up with PT/OT, encourage ambulation - Percocet for pain, Valium for muscle spasms - d/c home today with f/u in 2 weeks

## 2018-09-14 NOTE — Progress Notes (Signed)
Patient is discharged from room 3C11 at this time. Alert and in stable condition. IV site d/c'd and instruction read to patient with understanding verbalized. Left unit via wheelchair with all belongings at side.

## 2018-09-14 NOTE — Evaluation (Addendum)
Physical Therapy Evaluation/ Discharge Patient Details Name: Scott Mclean MRN: [id number] DOB: 23-Jul-1945 Today's Date: 09/14/2018   History of Present Illness  73 yo admitted for L3-5 PLIF. PMhx: HTN  Clinical Impression  PT very pleasant in chair on arrival. Pt educated for brace wear, precautions and progression of activity. Pt educated for all transfers, gait, function, balance and mobility. Pt was very independent painting the house and splitting wood PTA. Pt provided all necessary education and performing mobility at MOd I level. No further therapy needs with pt aware and agreeable. Will sign off with pt aware and ambulation encouraged.      Follow Up Recommendations      Equipment Recommendations       Recommendations for Other Services       Precautions / Restrictions Precautions Precautions: Back Precaution Booklet Issued: Yes (comment) Required Braces or Orthoses: Spinal Brace Spinal Brace: Thoracolumbosacral orthotic;Applied in sitting position      Mobility  Bed Mobility Overal bed mobility: Modified Independent                Transfers Overall transfer level: Modified independent                  Ambulation/Gait Ambulation/Gait assistance: Modified independent (Device/Increase time) Gait Distance (Feet): 400 Feet Assistive device: None Gait Pattern/deviations: Step-through pattern;Decreased stride length   Gait velocity interpretation: >4.37 ft/sec, indicative of normal walking speed General Gait Details: pt with good stability and speed without AD  Stairs Stairs: Yes Stairs assistance: Modified independent (Device/Increase time) Stair Management: Alternating pattern;Forwards;One rail Left Number of Stairs: 11    Wheelchair Mobility    Modified Rankin (Stroke Patients Only)       Balance Overall balance assessment: No apparent balance deficits (not formally assessed)                                            Pertinent Vitals/Pain Pain Assessment: 0-10 Pain Score: 3  Pain Location: incision Pain Descriptors / Indicators: Sore Pain Intervention(s): Limited activity within patient's tolerance;Premedicated before session;Repositioned    Home Living Family/patient expects to be discharged to:: Private residence Living Arrangements: Spouse/significant other Available Help at Discharge: Family;Available 24 hours/day Type of Home: House Home Access: Stairs to enter   [employer] of Steps: 4 Home Layout: One level;Able to live on main level with bedroom/bathroom Home Equipment: None Additional Comments: pt uses shower downstairs    Prior Function Level of Independence: Independent               Hand Dominance        Extremity/Trunk Assessment   Upper Extremity Assessment Upper Extremity Assessment: Overall WFL for tasks assessed    Lower Extremity Assessment Lower Extremity Assessment: Overall WFL for tasks assessed    Cervical / Trunk Assessment Cervical / Trunk Assessment: (post surgical guarding)  Communication   Communication: No difficulties  Cognition Arousal/Alertness: Awake/alert Behavior During Therapy: WFL for tasks assessed/performed Overall Cognitive Status: Within Functional Limits for tasks assessed                                        General Comments      Exercises     Assessment/Plan    PT Assessment Patent does not need any further PT  services  PT Problem List         PT Treatment Interventions      PT Goals (Current goals can be found in the Care Plan section)  Acute Rehab PT Goals Patient Stated Goal: return to log splitting PT Goal Formulation: All assessment and education complete, DC therapy    Frequency     Barriers to discharge        Co-evaluation               AM-PAC PT "6 Clicks" Mobility  Outcome Measure Help needed turning from your back to your side while in a flat bed  without using bedrails?: None Help needed moving from lying on your back to sitting on the side of a flat bed without using bedrails?: None Help needed moving to and from a bed to a chair (including a wheelchair)?: None Help needed standing up from a chair using your arms (e.g., wheelchair or bedside chair)?: None Help needed to walk in hospital room?: None Help needed climbing 3-5 steps with a railing? : None 6 Click Score: 24    End of Session Equipment Utilized During Treatment: Back brace Activity Tolerance: Patient tolerated treatment well Patient left: in bed;with call bell/phone within reach Nurse Communication: Mobility status PT Visit Diagnosis: Other abnormalities of gait and mobility (R26.89)    Time: [id number] PT Time Calculation (min) (ACUTE ONLY): 16 min   Charges:   PT Evaluation $PT Eval Moderate Complexity: [place] Pager: [phone] Office: [place] Peja Allender 09/14/2018, 8:44 AM

## 2018-09-14 NOTE — Evaluation (Signed)
Occupational Therapy Evaluation Patient Details Name: Scott Mclean MRN: [id number] DOB: 1945-01-30 Today's Date: 09/14/2018    History of Present Illness 73 yo admitted for L3-5 PLIF. PMhx: HTN   Clinical Impression   Pt PTA: living with spouse and pt independent. Pt currently limited by pain and increased need for assist for LB ADL. Pt donning brace with overhead technique. Pt education provided for LB ADL with hip kit. Pt donning/doffing clothes with AE provided. Pt performing grooming at sink with no difficulties. Back handout provided and reviewed ADL in detail. Pt educated on: clothing between brace, never sleep in brace, set an alarm at night for medication, avoid sitting for long periods of time, correct bed positioning for sleeping, correct sequence for bed mobility, avoiding lifting more than 5 pounds and never wash directly over incision. All education is complete and patient indicates understanding. Pt does not require continued OT skilled services. OT signing off.     Follow Up Recommendations  No OT follow up    Equipment Recommendations  None recommended by OT    Recommendations for Other Services       Precautions / Restrictions Precautions Precautions: Back Precaution Booklet Issued: Yes (comment) Precaution Comments: verbally discussed handout Required Braces or Orthoses: Spinal Brace Spinal Brace: Thoracolumbosacral orthotic;Applied in sitting position Restrictions Weight Bearing Restrictions: No      Mobility Bed Mobility Overal bed mobility: Modified Independent             General bed mobility comments: log roll completed   Transfers Overall transfer level: Modified independent                    Balance Overall balance assessment: No apparent balance deficits (not formally assessed)                                         ADL either performed or assessed with clinical judgement   ADL Overall ADL's : Needs  assistance/impaired Eating/Feeding: Independent   Grooming: Modified independent   Upper Body Bathing: Modified independent   Lower Body Bathing: Supervison/ safety;Set up;With adaptive equipment;Cueing for safety;Cueing for sequencing   Upper Body Dressing : Modified independent   Lower Body Dressing: Set up;Supervision/safety;Cueing for safety;Cueing for sequencing;With adaptive equipment   Toilet Transfer: Modified Independent   Toileting- Clothing Manipulation and Hygiene: Modified independent       Functional mobility during ADLs: Modified independent General ADL Comments: Pt education provided for LB ADL with hip kit. Pt donning/doffing clothes with AE provided. Pt performing grooming at sink with no difficulties.     Vision Baseline Vision/History: No visual deficits Vision Assessment?: No apparent visual deficits     Perception     Praxis      Pertinent Vitals/Pain Pain Assessment: 0-10 Pain Score: 4  Pain Location: incision Pain Descriptors / Indicators: Sore Pain Intervention(s): Monitored during session     Hand Dominance Right   Extremity/Trunk Assessment Upper Extremity Assessment Upper Extremity Assessment: Overall WFL for tasks assessed   Lower Extremity Assessment Lower Extremity Assessment: Overall WFL for tasks assessed   Cervical / Trunk Assessment Cervical / Trunk Assessment: Other exceptions Cervical / Trunk Exceptions: s/p back sx   Communication Communication Communication: No difficulties   Cognition Arousal/Alertness: Awake/alert Behavior During Therapy: WFL for tasks assessed/performed Overall Cognitive Status: Within Functional Limits for tasks assessed  General Comments       Exercises     Shoulder Instructions      Home Living Family/patient expects to be discharged to:: Private residence Living Arrangements: Spouse/significant other Available Help at Discharge:  Family;Available 24 hours/day Type of Home: House Home Access: Stairs to enter [employer] of Steps: 4 Entrance Stairs-Rails: Can reach both Home Layout: One level;Able to live on main level with bedroom/bathroom     Bathroom Shower/Tub: Teacher, early years/pre: Standard     Home Equipment: None   Additional Comments: pt uses shower downstairs      Prior Functioning/Environment Level of Independence: Independent                 OT Problem List:        OT Treatment/Interventions:      OT Goals(Current goals can be found in the care plan section) Acute Rehab OT Goals Patient Stated Goal: return to log splitting OT Goal Formulation: With patient  OT Frequency:     Barriers to D/C:            Co-evaluation              AM-PAC OT "6 Clicks" Daily Activity     Outcome Measure Help from another person eating meals?: None Help from another person taking care of personal grooming?: None Help from another person toileting, which includes using toliet, bedpan, or urinal?: None Help from another person bathing (including washing, rinsing, drying)?: A Little Help from another person to put on and taking off regular upper body clothing?: None Help from another person to put on and taking off regular lower body clothing?: A Little 6 Click Score: 22   End of Session Equipment Utilized During Treatment: Back brace Nurse Communication: Mobility status;Patient requests pain meds  Activity Tolerance: Patient tolerated treatment well Patient left: in chair;with call bell/phone within reach  OT Visit Diagnosis: Unsteadiness on feet (R26.81);Pain Pain - part of body: (back)                Time: 1610-9604 OT Time Calculation (min): 30 min Charges:  OT General Charges $OT Visit: 1 Visit OT Evaluation $OT Eval Moderate Complexity: 1 Mod OT Treatments $Self Care/Home Management : 8-22 mins  Ebony Hail Harold Hedge) Marsa Aris OTR/L Acute Rehabilitation  Services Pager: [phone] Office: [place] 09/14/2018, 10:19 AM

## 2018-09-14 NOTE — Anesthesia Postprocedure Evaluation (Signed)
Anesthesia Post Note  Patient: Scott Mclean  Procedure(s) Performed: LEFT-SIDED LUMBAR FOUR-FIVE, LUMBAR THREE-FOUR TRANSFORAMINAL LUMBAR INTERBODY FUSION WITH INSTRUMENTATION AND ALLOGRAFT (Left Spine Lumbar)     Patient location during evaluation: PACU Anesthesia Type: General Level of consciousness: awake and alert Pain management: pain level controlled Vital Signs Assessment: post-procedure vital signs reviewed and stable Respiratory status: spontaneous breathing, nonlabored ventilation, respiratory function stable and patient connected to nasal cannula oxygen Cardiovascular status: blood pressure returned to baseline and stable Postop Assessment: no apparent nausea or vomiting Anesthetic complications: no    Last Vitals:  Vitals:   09/13/18 2302 09/14/18 0337  BP: 102/75 112/63  Pulse: 86 83  Resp: 20 20  Temp: 36.8 C 36.9 C  SpO2: 93% 94%    Last Pain:  Vitals:   09/14/18 0612  TempSrc:   PainSc: [place]

## 2018-09-15 ENCOUNTER — Encounter (HOSPITAL_COMMUNITY): Payer: Self-pay | Admitting: Orthopedic Surgery

## 2018-09-19 MED FILL — Sodium Chloride IV Soln 0.9%: INTRAVENOUS | Qty: 1000 | Status: AC

## 2018-09-19 MED FILL — Sodium Chloride Irrigation Soln 0.9%: Qty: 3000 | Status: AC

## 2018-09-19 MED FILL — Heparin Sodium (Porcine) Inj 1000 Unit/ML: INTRAMUSCULAR | Qty: 30 | Status: AC

## 2018-10-02 NOTE — Discharge Summary (Signed)
Patient ID: Scott Mclean MRN: [id number] DOB/AGE: 1945/04/12 73 y.o.  Admit date: 09/13/2018 Discharge date: 09/14/2018  Admission Diagnoses:  Active Problems:   Radiculopathy   Discharge Diagnoses:  Same  Past Medical History:  Diagnosis Date  . Hypertension     Surgeries: Procedure(s): LEFT-SIDED LUMBAR FOUR-FIVE, LUMBAR THREE-FOUR TRANSFORAMINAL LUMBAR INTERBODY FUSION WITH INSTRUMENTATION AND ALLOGRAFT on 09/13/2018   Consultants: None  Discharged Condition: Improved  Hospital Course: Scott Mclean is an 73 y.o. male who was admitted 09/13/2018 for operative treatment of radiculopathy. Patient has severe unremitting pain that affects sleep, daily activities, and work/hobbies. After pre-op clearance the patient was taken to the operating room on 09/13/2018 and underwent  Procedure(s): LEFT-SIDED LUMBAR FOUR-FIVE, LUMBAR THREE-FOUR TRANSFORAMINAL LUMBAR INTERBODY FUSION WITH INSTRUMENTATION AND ALLOGRAFT.    Patient was given perioperative antibiotics:  Anti-infectives (From admission, onward)   Start     Dose/Rate Route Frequency Ordered Stop   09/13/18 2130  ceFAZolin (ANCEF) IVPB 2g/100 mL premix     2 g 200 mL/hr over 30 Minutes Intravenous Every 8 hours 09/13/18 1938 09/14/18 0743   09/13/18 1200  ceFAZolin (ANCEF) IVPB 2g/100 mL premix     2 g 200 mL/hr over 30 Minutes Intravenous To Short Stay 09/12/18 0854 09/13/18 1242       Patient was given sequential compression devices, early ambulation to prevent DVT.  Patient benefited maximally from hospital stay and there were no complications.    Recent vital sign: BP 121/66 (BP Location: Left Arm)   Pulse 73   Temp (!) 100.9 F (38.3 C) (Oral) Comment: pt drinking coffee  Resp 16   Ht 5\' 10"  (1.778 m)   Wt 104.3 kg   SpO2 95%   BMI 33.00 kg/m     Discharge Medications:   Allergies as of 09/14/2018      Reactions   Lisinopril Swelling   Tongue swelling   Omeprazole Other (See Comments)   Upset Stomach      Medication List    TAKE these medications   amLODipine 5 MG tablet Commonly known as: NORVASC Take 1 tablet by mouth once daily   aspirin EC 81 MG tablet Take 81 mg by mouth daily.   diazepam 5 MG tablet Commonly known as: VALIUM Take 1 tablet (5 mg total) by mouth every 6 (six) hours as needed for muscle spasms.   fluconazole 150 MG tablet Commonly known as: DIFLUCAN Take 1 tablet (150 mg total) by mouth once a week.   oxyCODONE-acetaminophen 5-325 MG tablet Commonly known as: PERCOCET/ROXICET Take 1-2 tablets by mouth every 4 (four) hours as needed for moderate pain or severe pain.   telmisartan 80 MG tablet Commonly known as: MICARDIS Take 80 mg by mouth daily.       Diagnostic Studies: Dg Lumbar Spine 2-3 Views  Result Date: 09/13/2018 CLINICAL DATA:  L3-L5 fusion EXAM: DG C-ARM 1-60 MIN; LUMBAR SPINE - 2-3 VIEW COMPARISON:  03/06/2018 FINDINGS: Posterior fusion changes from L3-L5. No hardware complicating feature. Normal alignment. IMPRESSION: Posterior fusion L3-L5. Electronically Signed   By: Rolm Baptise M.D.   On: 09/13/2018 19:18   Dg Lumbar Spine 1 View  Result Date: 09/13/2018 CLINICAL DATA:  Surgical fusion of lower lumbar spine. EXAM: LUMBAR SPINE - 1 VIEW COMPARISON:  MRI of February 21, 2018. FINDINGS: Single intraoperative cross-table lateral projection of the lumbar spine was obtained. These images demonstrate 1 surgical probe directed toward posterior spinous process of L2, with a second directed  toward posterior spinous process of L4. IMPRESSION: Surgical localization as described above. Electronically Signed   By: Marijo Conception M.D.   On: 09/13/2018 13:41   Dg C-arm 1-60 Min  Result Date: 09/13/2018 CLINICAL DATA:  L3-L5 fusion EXAM: DG C-ARM 1-60 MIN; LUMBAR SPINE - 2-3 VIEW COMPARISON:  03/06/2018 FINDINGS: Posterior fusion changes from L3-L5. No hardware complicating feature. Normal alignment. IMPRESSION: Posterior fusion L3-L5.  Electronically Signed   By: Rolm Baptise M.D.   On: 09/13/2018 19:18    Disposition:    POD #1 s/p L3-L5 decompression and fusion, doing well  - up with PT/OT, encourage ambulation - Percocet for pain, Valium for muscle spasms - d/c home today with f/u in 2 weeks   Signed: Lennie Muckle Jary Louvier 10/02/2018, 2:04 PM

## 2018-10-04 DIAGNOSIS — M4316 Spondylolisthesis, lumbar region: Secondary | ICD-10-CM | POA: Diagnosis not present

## 2018-10-04 DIAGNOSIS — Z981 Arthrodesis status: Secondary | ICD-10-CM | POA: Diagnosis not present

## 2018-10-30 DIAGNOSIS — M4316 Spondylolisthesis, lumbar region: Secondary | ICD-10-CM | POA: Diagnosis not present

## 2018-11-03 DIAGNOSIS — H524 Presbyopia: Secondary | ICD-10-CM | POA: Diagnosis not present

## 2018-11-29 DIAGNOSIS — I10 Essential (primary) hypertension: Secondary | ICD-10-CM | POA: Diagnosis not present

## 2018-12-11 DIAGNOSIS — M545 Low back pain: Secondary | ICD-10-CM | POA: Diagnosis not present

## 2019-01-15 DIAGNOSIS — R739 Hyperglycemia, unspecified: Secondary | ICD-10-CM | POA: Diagnosis not present

## 2019-01-15 DIAGNOSIS — I10 Essential (primary) hypertension: Secondary | ICD-10-CM | POA: Diagnosis not present

## 2019-01-15 DIAGNOSIS — E039 Hypothyroidism, unspecified: Secondary | ICD-10-CM | POA: Diagnosis not present

## 2019-01-15 DIAGNOSIS — E7800 Pure hypercholesterolemia, unspecified: Secondary | ICD-10-CM | POA: Diagnosis not present

## 2019-01-23 DIAGNOSIS — I10 Essential (primary) hypertension: Secondary | ICD-10-CM | POA: Diagnosis not present

## 2019-01-23 DIAGNOSIS — R739 Hyperglycemia, unspecified: Secondary | ICD-10-CM | POA: Diagnosis not present

## 2019-01-23 DIAGNOSIS — Z0000 Encounter for general adult medical examination without abnormal findings: Secondary | ICD-10-CM | POA: Diagnosis not present

## 2019-01-23 DIAGNOSIS — E785 Hyperlipidemia, unspecified: Secondary | ICD-10-CM | POA: Diagnosis not present

## 2019-01-23 DIAGNOSIS — J301 Allergic rhinitis due to pollen: Secondary | ICD-10-CM | POA: Diagnosis not present

## 2019-03-24 DIAGNOSIS — S8990XA Unspecified injury of unspecified lower leg, initial encounter: Secondary | ICD-10-CM | POA: Diagnosis not present

## 2019-05-07 DIAGNOSIS — S335XXD Sprain of ligaments of lumbar spine, subsequent encounter: Secondary | ICD-10-CM | POA: Diagnosis not present

## 2019-05-07 DIAGNOSIS — M545 Low back pain: Secondary | ICD-10-CM | POA: Diagnosis not present

## 2019-05-07 DIAGNOSIS — M4326 Fusion of spine, lumbar region: Secondary | ICD-10-CM | POA: Diagnosis not present

## 2019-06-25 DIAGNOSIS — K5900 Constipation, unspecified: Secondary | ICD-10-CM | POA: Diagnosis not present

## 2019-06-25 DIAGNOSIS — G5600 Carpal tunnel syndrome, unspecified upper limb: Secondary | ICD-10-CM | POA: Diagnosis not present

## 2019-06-25 DIAGNOSIS — M549 Dorsalgia, unspecified: Secondary | ICD-10-CM | POA: Diagnosis not present

## 2019-06-25 DIAGNOSIS — Z9109 Other allergy status, other than to drugs and biological substances: Secondary | ICD-10-CM | POA: Diagnosis not present

## 2019-07-17 DIAGNOSIS — E785 Hyperlipidemia, unspecified: Secondary | ICD-10-CM | POA: Diagnosis not present

## 2019-07-17 DIAGNOSIS — I10 Essential (primary) hypertension: Secondary | ICD-10-CM | POA: Diagnosis not present

## 2019-07-17 DIAGNOSIS — R739 Hyperglycemia, unspecified: Secondary | ICD-10-CM | POA: Diagnosis not present

## 2019-07-18 DIAGNOSIS — J309 Allergic rhinitis, unspecified: Secondary | ICD-10-CM | POA: Diagnosis not present

## 2019-07-24 DIAGNOSIS — Z0000 Encounter for general adult medical examination without abnormal findings: Secondary | ICD-10-CM | POA: Diagnosis not present

## 2019-07-24 DIAGNOSIS — Z9109 Other allergy status, other than to drugs and biological substances: Secondary | ICD-10-CM | POA: Diagnosis not present

## 2019-08-20 DIAGNOSIS — Z7189 Other specified counseling: Secondary | ICD-10-CM | POA: Diagnosis not present

## 2019-08-20 DIAGNOSIS — R42 Dizziness and giddiness: Secondary | ICD-10-CM | POA: Diagnosis not present

## 2019-10-13 DIAGNOSIS — M25571 Pain in right ankle and joints of right foot: Secondary | ICD-10-CM | POA: Diagnosis not present

## 2019-10-26 DIAGNOSIS — J019 Acute sinusitis, unspecified: Secondary | ICD-10-CM | POA: Diagnosis not present

## 2019-11-06 DIAGNOSIS — J309 Allergic rhinitis, unspecified: Secondary | ICD-10-CM | POA: Diagnosis not present

## 2019-12-08 DIAGNOSIS — M25511 Pain in right shoulder: Secondary | ICD-10-CM | POA: Diagnosis not present

## 2019-12-22 DIAGNOSIS — M25511 Pain in right shoulder: Secondary | ICD-10-CM | POA: Diagnosis not present

## 2020-01-11 DIAGNOSIS — S46011A Strain of muscle(s) and tendon(s) of the rotator cuff of right shoulder, initial encounter: Secondary | ICD-10-CM | POA: Diagnosis not present

## 2020-01-11 DIAGNOSIS — M75121 Complete rotator cuff tear or rupture of right shoulder, not specified as traumatic: Secondary | ICD-10-CM | POA: Diagnosis not present

## 2020-01-16 DIAGNOSIS — R7303 Prediabetes: Secondary | ICD-10-CM | POA: Diagnosis not present

## 2020-01-16 DIAGNOSIS — E785 Hyperlipidemia, unspecified: Secondary | ICD-10-CM | POA: Diagnosis not present

## 2020-01-16 DIAGNOSIS — R7309 Other abnormal glucose: Secondary | ICD-10-CM | POA: Diagnosis not present

## 2020-01-16 DIAGNOSIS — R946 Abnormal results of thyroid function studies: Secondary | ICD-10-CM | POA: Diagnosis not present

## 2020-01-16 DIAGNOSIS — E7800 Pure hypercholesterolemia, unspecified: Secondary | ICD-10-CM | POA: Diagnosis not present

## 2020-01-16 DIAGNOSIS — I10 Essential (primary) hypertension: Secondary | ICD-10-CM | POA: Diagnosis not present

## 2020-01-17 ENCOUNTER — Telehealth: Payer: Self-pay | Admitting: *Deleted

## 2020-01-17 NOTE — Telephone Encounter (Signed)
Our office received a fax today though was not clear why we received the fax. I called [facility] Dr. Theda Sers office and stated that we have never seen this pt. Informed that it looks like the pt see's Dr. Einar Gip, though the last I saw was 2019. I gave the address and phone # for DR. Ganji's office.

## 2020-01-24 DIAGNOSIS — G250 Essential tremor: Secondary | ICD-10-CM | POA: Diagnosis not present

## 2020-01-24 DIAGNOSIS — E7800 Pure hypercholesterolemia, unspecified: Secondary | ICD-10-CM | POA: Diagnosis not present

## 2020-01-24 DIAGNOSIS — K449 Diaphragmatic hernia without obstruction or gangrene: Secondary | ICD-10-CM | POA: Diagnosis not present

## 2020-01-24 DIAGNOSIS — I10 Essential (primary) hypertension: Secondary | ICD-10-CM | POA: Diagnosis not present

## 2020-01-24 DIAGNOSIS — E785 Hyperlipidemia, unspecified: Secondary | ICD-10-CM | POA: Diagnosis not present

## 2020-01-24 DIAGNOSIS — R7309 Other abnormal glucose: Secondary | ICD-10-CM | POA: Diagnosis not present

## 2020-01-31 DIAGNOSIS — M7581 Other shoulder lesions, right shoulder: Secondary | ICD-10-CM | POA: Diagnosis not present

## 2020-01-31 DIAGNOSIS — M7541 Impingement syndrome of right shoulder: Secondary | ICD-10-CM | POA: Diagnosis not present

## 2020-01-31 DIAGNOSIS — G8918 Other acute postprocedural pain: Secondary | ICD-10-CM | POA: Diagnosis not present

## 2020-01-31 DIAGNOSIS — S46011A Strain of muscle(s) and tendon(s) of the rotator cuff of right shoulder, initial encounter: Secondary | ICD-10-CM | POA: Diagnosis not present

## 2020-01-31 DIAGNOSIS — S46191A Other injury of muscle, fascia and tendon of long head of biceps, right arm, initial encounter: Secondary | ICD-10-CM | POA: Diagnosis not present

## 2020-01-31 HISTORY — PX: OTHER SURGICAL HISTORY: SHX169

## 2020-02-04 DIAGNOSIS — K120 Recurrent oral aphthae: Secondary | ICD-10-CM | POA: Diagnosis not present

## 2020-02-14 DIAGNOSIS — M75121 Complete rotator cuff tear or rupture of right shoulder, not specified as traumatic: Secondary | ICD-10-CM | POA: Diagnosis not present

## 2020-02-22 DIAGNOSIS — M75121 Complete rotator cuff tear or rupture of right shoulder, not specified as traumatic: Secondary | ICD-10-CM | POA: Diagnosis not present

## 2020-02-26 DIAGNOSIS — M75121 Complete rotator cuff tear or rupture of right shoulder, not specified as traumatic: Secondary | ICD-10-CM | POA: Diagnosis not present

## 2020-02-28 DIAGNOSIS — M75121 Complete rotator cuff tear or rupture of right shoulder, not specified as traumatic: Secondary | ICD-10-CM | POA: Diagnosis not present

## 2020-03-04 DIAGNOSIS — M75121 Complete rotator cuff tear or rupture of right shoulder, not specified as traumatic: Secondary | ICD-10-CM | POA: Diagnosis not present

## 2020-03-06 DIAGNOSIS — M75121 Complete rotator cuff tear or rupture of right shoulder, not specified as traumatic: Secondary | ICD-10-CM | POA: Diagnosis not present

## 2020-03-11 DIAGNOSIS — M75121 Complete rotator cuff tear or rupture of right shoulder, not specified as traumatic: Secondary | ICD-10-CM | POA: Diagnosis not present

## 2020-03-13 DIAGNOSIS — M75121 Complete rotator cuff tear or rupture of right shoulder, not specified as traumatic: Secondary | ICD-10-CM | POA: Diagnosis not present

## 2020-03-21 DIAGNOSIS — M75121 Complete rotator cuff tear or rupture of right shoulder, not specified as traumatic: Secondary | ICD-10-CM | POA: Diagnosis not present

## 2020-03-25 DIAGNOSIS — M75121 Complete rotator cuff tear or rupture of right shoulder, not specified as traumatic: Secondary | ICD-10-CM | POA: Diagnosis not present

## 2020-03-27 DIAGNOSIS — M75121 Complete rotator cuff tear or rupture of right shoulder, not specified as traumatic: Secondary | ICD-10-CM | POA: Diagnosis not present

## 2020-04-03 DIAGNOSIS — M75121 Complete rotator cuff tear or rupture of right shoulder, not specified as traumatic: Secondary | ICD-10-CM | POA: Diagnosis not present

## 2020-04-06 DIAGNOSIS — M65242 Calcific tendinitis, left hand: Secondary | ICD-10-CM | POA: Diagnosis not present

## 2020-04-08 DIAGNOSIS — M75121 Complete rotator cuff tear or rupture of right shoulder, not specified as traumatic: Secondary | ICD-10-CM | POA: Diagnosis not present

## 2020-04-10 DIAGNOSIS — M75121 Complete rotator cuff tear or rupture of right shoulder, not specified as traumatic: Secondary | ICD-10-CM | POA: Diagnosis not present

## 2020-04-15 DIAGNOSIS — M75121 Complete rotator cuff tear or rupture of right shoulder, not specified as traumatic: Secondary | ICD-10-CM | POA: Diagnosis not present

## 2020-04-17 DIAGNOSIS — M75121 Complete rotator cuff tear or rupture of right shoulder, not specified as traumatic: Secondary | ICD-10-CM | POA: Diagnosis not present

## 2020-04-22 DIAGNOSIS — M75121 Complete rotator cuff tear or rupture of right shoulder, not specified as traumatic: Secondary | ICD-10-CM | POA: Diagnosis not present

## 2020-04-24 DIAGNOSIS — M75121 Complete rotator cuff tear or rupture of right shoulder, not specified as traumatic: Secondary | ICD-10-CM | POA: Diagnosis not present

## 2020-04-28 DIAGNOSIS — Z09 Encounter for follow-up examination after completed treatment for conditions other than malignant neoplasm: Secondary | ICD-10-CM | POA: Diagnosis not present

## 2020-05-15 DIAGNOSIS — R739 Hyperglycemia, unspecified: Secondary | ICD-10-CM | POA: Diagnosis not present

## 2020-05-15 DIAGNOSIS — E785 Hyperlipidemia, unspecified: Secondary | ICD-10-CM | POA: Diagnosis not present

## 2020-05-15 DIAGNOSIS — I10 Essential (primary) hypertension: Secondary | ICD-10-CM | POA: Diagnosis not present

## 2020-05-22 DIAGNOSIS — K5900 Constipation, unspecified: Secondary | ICD-10-CM | POA: Diagnosis not present

## 2020-05-22 DIAGNOSIS — I10 Essential (primary) hypertension: Secondary | ICD-10-CM | POA: Diagnosis not present

## 2020-05-22 DIAGNOSIS — E119 Type 2 diabetes mellitus without complications: Secondary | ICD-10-CM | POA: Diagnosis not present

## 2020-05-22 DIAGNOSIS — Z9109 Other allergy status, other than to drugs and biological substances: Secondary | ICD-10-CM | POA: Diagnosis not present

## 2020-05-22 DIAGNOSIS — E7800 Pure hypercholesterolemia, unspecified: Secondary | ICD-10-CM | POA: Diagnosis not present

## 2020-06-05 DIAGNOSIS — R051 Acute cough: Secondary | ICD-10-CM | POA: Diagnosis not present

## 2020-06-05 DIAGNOSIS — J069 Acute upper respiratory infection, unspecified: Secondary | ICD-10-CM | POA: Diagnosis not present

## 2020-06-05 DIAGNOSIS — R0981 Nasal congestion: Secondary | ICD-10-CM | POA: Diagnosis not present

## 2020-06-09 DIAGNOSIS — H40013 Open angle with borderline findings, low risk, bilateral: Secondary | ICD-10-CM | POA: Diagnosis not present

## 2020-06-09 DIAGNOSIS — R059 Cough, unspecified: Secondary | ICD-10-CM | POA: Diagnosis not present

## 2020-06-09 DIAGNOSIS — J029 Acute pharyngitis, unspecified: Secondary | ICD-10-CM | POA: Diagnosis not present

## 2020-06-09 DIAGNOSIS — R0981 Nasal congestion: Secondary | ICD-10-CM | POA: Diagnosis not present

## 2020-06-09 DIAGNOSIS — J209 Acute bronchitis, unspecified: Secondary | ICD-10-CM | POA: Diagnosis not present

## 2020-06-09 DIAGNOSIS — J301 Allergic rhinitis due to pollen: Secondary | ICD-10-CM | POA: Diagnosis not present

## 2020-10-15 DIAGNOSIS — J209 Acute bronchitis, unspecified: Secondary | ICD-10-CM | POA: Diagnosis not present

## 2020-10-15 DIAGNOSIS — J0100 Acute maxillary sinusitis, unspecified: Secondary | ICD-10-CM | POA: Diagnosis not present

## 2020-11-04 DIAGNOSIS — J324 Chronic pansinusitis: Secondary | ICD-10-CM | POA: Diagnosis not present

## 2020-11-04 DIAGNOSIS — R051 Acute cough: Secondary | ICD-10-CM | POA: Diagnosis not present

## 2020-11-25 DIAGNOSIS — G250 Essential tremor: Secondary | ICD-10-CM | POA: Diagnosis not present

## 2020-11-25 DIAGNOSIS — E119 Type 2 diabetes mellitus without complications: Secondary | ICD-10-CM | POA: Diagnosis not present

## 2020-11-25 DIAGNOSIS — E7800 Pure hypercholesterolemia, unspecified: Secondary | ICD-10-CM | POA: Diagnosis not present

## 2020-11-25 DIAGNOSIS — R946 Abnormal results of thyroid function studies: Secondary | ICD-10-CM | POA: Diagnosis not present

## 2020-11-25 DIAGNOSIS — I10 Essential (primary) hypertension: Secondary | ICD-10-CM | POA: Diagnosis not present

## 2020-11-25 DIAGNOSIS — E785 Hyperlipidemia, unspecified: Secondary | ICD-10-CM | POA: Diagnosis not present

## 2020-11-25 DIAGNOSIS — Z0000 Encounter for general adult medical examination without abnormal findings: Secondary | ICD-10-CM | POA: Diagnosis not present

## 2020-12-04 HISTORY — PX: OTHER SURGICAL HISTORY: SHX169

## 2020-12-12 ENCOUNTER — Other Ambulatory Visit: Payer: Self-pay

## 2020-12-12 ENCOUNTER — Ambulatory Visit (HOSPITAL_COMMUNITY)
Admission: EM | Admit: 2020-12-12 | Discharge: 2020-12-13 | Disposition: A | Discharge status: Home / Self Care | Payer: Medicare Other | Attending: Emergency Medicine | Admitting: Emergency Medicine

## 2020-12-12 ENCOUNTER — Emergency Department (HOSPITAL_COMMUNITY): Payer: Medicare Other

## 2020-12-12 ENCOUNTER — Encounter (HOSPITAL_COMMUNITY): Payer: Self-pay | Admitting: Emergency Medicine

## 2020-12-12 DIAGNOSIS — Z20822 Contact with and (suspected) exposure to covid-19: Secondary | ICD-10-CM | POA: Insufficient documentation

## 2020-12-12 DIAGNOSIS — S68521A Partial traumatic transphalangeal amputation of right thumb, initial encounter: Secondary | ICD-10-CM | POA: Insufficient documentation

## 2020-12-12 DIAGNOSIS — S62521A Displaced fracture of distal phalanx of right thumb, initial encounter for closed fracture: Secondary | ICD-10-CM | POA: Diagnosis not present

## 2020-12-12 DIAGNOSIS — W312XXA Contact with powered woodworking and forming machines, initial encounter: Secondary | ICD-10-CM | POA: Diagnosis not present

## 2020-12-12 DIAGNOSIS — S62521B Displaced fracture of distal phalanx of right thumb, initial encounter for open fracture: Secondary | ICD-10-CM | POA: Diagnosis not present

## 2020-12-12 DIAGNOSIS — Z23 Encounter for immunization: Secondary | ICD-10-CM | POA: Diagnosis not present

## 2020-12-12 DIAGNOSIS — I10 Essential (primary) hypertension: Secondary | ICD-10-CM | POA: Diagnosis not present

## 2020-12-12 DIAGNOSIS — S68511A Complete traumatic transphalangeal amputation of right thumb, initial encounter: Secondary | ICD-10-CM | POA: Diagnosis not present

## 2020-12-12 NOTE — ED Provider Notes (Signed)
Emergency Medicine Provider Triage Evaluation Note  Scott Mclean , a 75 y.o. male  was evaluated in triage.  Pt complains of right thumb injury around 1430 today. The patient was using a wood chipper. UC sent him over and reported the bleeding was controlled, but there was exposed bone.  Review of Systems  Positive: Thumb pain Negative:   Physical Exam  BP (!) 167/118 (BP Location: Right Arm)   Pulse 93   Temp 98.5 F (36.9 C)   Resp 18   SpO2 95%  Gen:   Awake, no distress   Resp:  Normal effort  MSK:   Moves extremities without difficulty  Other:  Bandage covering right thumb. Will defer removing pressure bandage until patient is further evaluated.  Medical Decision Making  Medically screening exam initiated at 6:57 PM.  Appropriate orders placed.  Scott Mclean was informed that the remainder of the evaluation will be completed by another provider, this initial triage assessment does not replace that evaluation, and the importance of remaining in the ED until their evaluation is complete.  Imaging ordered   Sherrell Puller, PA-C 12/12/20 1859    Tegeler, Gwenyth Allegra, MD 12/12/20 [phone]

## 2020-12-12 NOTE — ED Notes (Signed)
Received phone report from [facility] for traumatic tip of R thumb. Pt cut it off in a wood chipper, per NP at UC bone is exposed. Thumb was soaked in sterile saline, wrapped w/ pressure dressing, bleeding controlled

## 2020-12-13 ENCOUNTER — Emergency Department (HOSPITAL_COMMUNITY): Payer: Medicare Other | Admitting: Anesthesiology

## 2020-12-13 ENCOUNTER — Encounter (HOSPITAL_COMMUNITY)
Admission: EM | Disposition: A | Discharge status: Home / Self Care | Payer: Self-pay | Source: Home / Self Care | Attending: Emergency Medicine

## 2020-12-13 DIAGNOSIS — S68521A Partial traumatic transphalangeal amputation of right thumb, initial encounter: Secondary | ICD-10-CM | POA: Diagnosis not present

## 2020-12-13 DIAGNOSIS — Z23 Encounter for immunization: Secondary | ICD-10-CM | POA: Diagnosis not present

## 2020-12-13 DIAGNOSIS — Z20822 Contact with and (suspected) exposure to covid-19: Secondary | ICD-10-CM | POA: Diagnosis not present

## 2020-12-13 DIAGNOSIS — I10 Essential (primary) hypertension: Secondary | ICD-10-CM | POA: Diagnosis not present

## 2020-12-13 DIAGNOSIS — S62521B Displaced fracture of distal phalanx of right thumb, initial encounter for open fracture: Secondary | ICD-10-CM | POA: Diagnosis not present

## 2020-12-13 DIAGNOSIS — S68011A Complete traumatic metacarpophalangeal amputation of right thumb, initial encounter: Secondary | ICD-10-CM | POA: Diagnosis not present

## 2020-12-13 DIAGNOSIS — S65411A Laceration of blood vessel of right thumb, initial encounter: Secondary | ICD-10-CM | POA: Diagnosis not present

## 2020-12-13 DIAGNOSIS — S68511A Complete traumatic transphalangeal amputation of right thumb, initial encounter: Secondary | ICD-10-CM | POA: Diagnosis not present

## 2020-12-13 HISTORY — PX: ARTERY EXPLORATION: SHX5110

## 2020-12-13 HISTORY — PX: I & D EXTREMITY: SHX5045

## 2020-12-13 LAB — RESP PANEL BY RT-PCR (FLU A&B, COVID) ARPGX2
Influenza A by PCR: NEGATIVE
Influenza B by PCR: NEGATIVE
SARS Coronavirus 2 by RT PCR: NEGATIVE

## 2020-12-13 SURGERY — IRRIGATION AND DEBRIDEMENT EXTREMITY
Anesthesia: General | Site: Thumb | Laterality: Right

## 2020-12-13 MED ORDER — LIDOCAINE HCL (CARDIAC) PF 100 MG/5ML IV SOSY
PREFILLED_SYRINGE | INTRAVENOUS | Status: DC | PRN
  Administered 2020-12-13: 60 mg via INTRAVENOUS

## 2020-12-13 MED ORDER — BUPIVACAINE HCL (PF) 0.25 % IJ SOLN
INTRAMUSCULAR | Status: DC | PRN
  Administered 2020-12-13: 9 mL

## 2020-12-13 MED ORDER — PROPOFOL 10 MG/ML IV BOLUS
INTRAVENOUS | Status: AC
  Filled 2020-12-13: qty 20

## 2020-12-13 MED ORDER — SUCCINYLCHOLINE 20MG/ML (10ML) SYRINGE FOR MEDFUSION PUMP - OPTIME
INTRAMUSCULAR | Status: DC | PRN
  Administered 2020-12-13: 100 mg via INTRAVENOUS

## 2020-12-13 MED ORDER — TETANUS-DIPHTH-ACELL PERTUSSIS 5-2.5-18.5 LF-MCG/0.5 IM SUSY
0.5000 mL | PREFILLED_SYRINGE | Freq: Once | INTRAMUSCULAR | Status: AC
  Administered 2020-12-13: 0.5 mL via INTRAMUSCULAR
  Filled 2020-12-13: qty 0.5

## 2020-12-13 MED ORDER — PROPOFOL 10 MG/ML IV BOLUS
INTRAVENOUS | Status: DC | PRN
  Administered 2020-12-13: 120 mg via INTRAVENOUS
  Administered 2020-12-13: 100 mg via INTRAVENOUS

## 2020-12-13 MED ORDER — LACTATED RINGERS IV SOLN: INTRAVENOUS | Status: DC | PRN

## 2020-12-13 MED ORDER — BUPIVACAINE HCL (PF) 0.25 % IJ SOLN
INTRAMUSCULAR | Status: AC
  Filled 2020-12-13: qty 30

## 2020-12-13 MED ORDER — FENTANYL CITRATE (PF) 100 MCG/2ML IJ SOLN: 25.0000 ug | INTRAMUSCULAR | Status: DC | PRN

## 2020-12-13 MED ORDER — BUPIVACAINE HCL (PF) 0.5 % IJ SOLN
10.0000 mL | Freq: Once | INTRAMUSCULAR | Status: AC
  Administered 2020-12-13: 10 mL
  Filled 2020-12-13: qty 10

## 2020-12-13 MED ORDER — MIDAZOLAM HCL 2 MG/2ML IJ SOLN
INTRAMUSCULAR | Status: AC
  Filled 2020-12-13: qty 2

## 2020-12-13 MED ORDER — HYDROCODONE-ACETAMINOPHEN 5-325 MG PO TABS: ORAL_TABLET | ORAL | 0 refills | Status: DC

## 2020-12-13 MED ORDER — 0.9 % SODIUM CHLORIDE (POUR BTL) OPTIME
TOPICAL | Status: DC | PRN
  Administered 2020-12-13: 1000 mL

## 2020-12-13 MED ORDER — FENTANYL CITRATE (PF) 250 MCG/5ML IJ SOLN
INTRAMUSCULAR | Status: DC | PRN
  Administered 2020-12-13: 50 ug via INTRAVENOUS

## 2020-12-13 MED ORDER — PHENYLEPHRINE HCL (PRESSORS) 10 MG/ML IV SOLN
INTRAVENOUS | Status: DC | PRN
  Administered 2020-12-13 (×2): 80 ug via INTRAVENOUS

## 2020-12-13 MED ORDER — CEFAZOLIN SODIUM-DEXTROSE 2-4 GM/100ML-% IV SOLN
2.0000 g | INTRAVENOUS | Status: AC
  Administered 2020-12-13: 2 g via INTRAVENOUS
  Filled 2020-12-13: qty 100

## 2020-12-13 MED ORDER — SULFAMETHOXAZOLE-TRIMETHOPRIM 800-160 MG PO TABS: 1.0000 | ORAL_TABLET | Freq: Two times a day (BID) | ORAL | 0 refills | Status: DC

## 2020-12-13 MED ORDER — FENTANYL CITRATE (PF) 250 MCG/5ML IJ SOLN
INTRAMUSCULAR | Status: AC
  Filled 2020-12-13: qty 5

## 2020-12-13 MED ORDER — EPHEDRINE SULFATE 50 MG/ML IJ SOLN
INTRAMUSCULAR | Status: DC | PRN
  Administered 2020-12-13: 5 mg via INTRAVENOUS
  Administered 2020-12-13 (×2): 10 mg via INTRAVENOUS

## 2020-12-13 MED ORDER — MIDAZOLAM HCL 2 MG/2ML IJ SOLN
INTRAMUSCULAR | Status: DC | PRN
  Administered 2020-12-13: 2 mg via INTRAVENOUS

## 2020-12-13 MED ORDER — ONDANSETRON HCL 4 MG/2ML IJ SOLN
INTRAMUSCULAR | Status: DC | PRN
  Administered 2020-12-13: 4 mg via INTRAVENOUS

## 2020-12-13 SURGICAL SUPPLY — 61 items
ADAPTER CATH SYR TO TUBING 38M (ADAPTER) ×2 IMPLANT
ADPR CATH LL SYR 3/32 TPR (ADAPTER) ×1
BAG COUNTER SPONGE SURGICOUNT (BAG) ×2 IMPLANT
BAG SPNG CNTER NS LX DISP (BAG) ×1
BNDG CMPR 9X4 STRL LF SNTH (GAUZE/BANDAGES/DRESSINGS) ×1
BNDG COHESIVE 1X5 TAN STRL LF (GAUZE/BANDAGES/DRESSINGS) ×1 IMPLANT
BNDG COHESIVE 2X5 TAN STRL LF (GAUZE/BANDAGES/DRESSINGS) IMPLANT
BNDG ELASTIC 3X5.8 VLCR STR LF (GAUZE/BANDAGES/DRESSINGS) ×2 IMPLANT
BNDG ELASTIC 4X5.8 VLCR STR LF (GAUZE/BANDAGES/DRESSINGS) ×2 IMPLANT
BNDG ESMARK 4X9 LF (GAUZE/BANDAGES/DRESSINGS) ×1 IMPLANT
BNDG GAUZE ELAST 4 BULKY (GAUZE/BANDAGES/DRESSINGS) ×2 IMPLANT
CANNULA VESSEL 3MM 2 BLNT TIP (CANNULA) IMPLANT
CORD BIPOLAR FORCEPS 12FT (ELECTRODE) ×3 IMPLANT
COVER SURGICAL LIGHT HANDLE (MISCELLANEOUS) ×2 IMPLANT
CUFF TOURN SGL QUICK 18X4 (TOURNIQUET CUFF) ×1 IMPLANT
CUFF TOURN SGL QUICK 24 (TOURNIQUET CUFF)
CUFF TRNQT CYL 24X4X16.5-23 (TOURNIQUET CUFF) IMPLANT
DECANTER SPIKE VIAL GLASS SM (MISCELLANEOUS) ×2 IMPLANT
DRAIN PENROSE 1/4X12 LTX STRL (WOUND CARE) IMPLANT
DRAPE HALF SHEET 70X43 (DRAPES) ×2 IMPLANT
DRSG PAD ABDOMINAL 8X10 ST (GAUZE/BANDAGES/DRESSINGS) ×4 IMPLANT
GAUZE SPONGE 4X4 12PLY STRL (GAUZE/BANDAGES/DRESSINGS) ×3 IMPLANT
GAUZE SPONGE 4X4 12PLY STRL LF (GAUZE/BANDAGES/DRESSINGS) ×1 IMPLANT
GAUZE XEROFORM 1X8 LF (GAUZE/BANDAGES/DRESSINGS) ×3 IMPLANT
GLOVE SRG 8 PF TXTR STRL LF DI (GLOVE) ×1 IMPLANT
GLOVE SURG ENC MOIS LTX SZ7.5 (GLOVE) ×3 IMPLANT
GLOVE SURG ORTHO LTX SZ8 (GLOVE) IMPLANT
GLOVE SURG POLY ORTHO LF SZ8 (GLOVE) IMPLANT
GLOVE SURG UNDER POLY LF SZ8 (GLOVE) ×2
GLOVE SURG UNDER POLY LF SZ8.5 (GLOVE) IMPLANT
GOWN STRL REUS W/ TWL LRG LVL3 (GOWN DISPOSABLE) ×1 IMPLANT
GOWN STRL REUS W/ TWL XL LVL3 (GOWN DISPOSABLE) ×1 IMPLANT
GOWN STRL REUS W/TWL LRG LVL3 (GOWN DISPOSABLE) ×2
GOWN STRL REUS W/TWL XL LVL3 (GOWN DISPOSABLE) ×2
KIT BASIN OR (CUSTOM PROCEDURE TRAY) ×2 IMPLANT
KIT TURNOVER KIT B (KITS) ×2 IMPLANT
LOOP VESSEL MAXI BLUE (MISCELLANEOUS) IMPLANT
MANIFOLD NEPTUNE II (INSTRUMENTS) IMPLANT
NDL HYPO 25X1 1.5 SAFETY (NEEDLE) IMPLANT
NEEDLE HYPO 25X1 1.5 SAFETY (NEEDLE) ×2 IMPLANT
NS IRRIG 1000ML POUR BTL (IV SOLUTION) ×2 IMPLANT
PACK ORTHO EXTREMITY (CUSTOM PROCEDURE TRAY) ×2 IMPLANT
PAD ARMBOARD 7.5X6 YLW CONV (MISCELLANEOUS) ×4 IMPLANT
SET CYSTO W/LG BORE CLAMP LF (SET/KITS/TRAYS/PACK) IMPLANT
SOL PREP POV-IOD 4OZ 10% (MISCELLANEOUS) ×4 IMPLANT
SPLINT FINGER 5.25 W/BULB ALUM (SOFTGOODS) ×2 IMPLANT
SPONGE T-LAP 18X18 ~~LOC~~+RFID (SPONGE) ×1 IMPLANT
SPONGE T-LAP 4X18 ~~LOC~~+RFID (SPONGE) ×2 IMPLANT
SUT ETHILON 4 0 P 3 18 (SUTURE) IMPLANT
SUT ETHILON 4 0 PS 2 18 (SUTURE) IMPLANT
SUT MON AB 5-0 P3 18 (SUTURE) IMPLANT
SUT MON AB 5-0 PS2 18 (SUTURE) ×2 IMPLANT
SWAB COLLECTION DEVICE MRSA (MISCELLANEOUS) IMPLANT
SWAB CULTURE ESWAB REG 1ML (MISCELLANEOUS) IMPLANT
SYR 20ML LL LF (SYRINGE) ×1 IMPLANT
SYR CONTROL 10ML LL (SYRINGE) IMPLANT
TOWEL GREEN STERILE (TOWEL DISPOSABLE) ×2 IMPLANT
TUBE CONNECTING 12X1/4 (SUCTIONS) ×1 IMPLANT
TUBE FEEDING ENTERAL 5FR 16IN (TUBING) IMPLANT
UNDERPAD 30X36 HEAVY ABSORB (UNDERPADS AND DIAPERS) ×2 IMPLANT
YANKAUER SUCT BULB TIP NO VENT (SUCTIONS) ×2 IMPLANT

## 2020-12-13 NOTE — Discharge Instructions (Signed)

## 2020-12-13 NOTE — Anesthesia Procedure Notes (Signed)
Procedure Name: Intubation Date/Time: 12/13/2020 3:21 AM Performed by: Valetta Fuller, CRNA Pre-anesthesia Checklist: Patient identified, Emergency Drugs available, Suction available and Patient being monitored Patient Re-evaluated:Patient Re-evaluated prior to induction Oxygen Delivery Method: Circle system utilized Preoxygenation: Pre-oxygenation with 100% oxygen Induction Type: IV induction, Rapid sequence and Cricoid Pressure applied Laryngoscope Size: Miller and 2 Grade View: Grade I Tube type: Oral Tube size: 7.5 mm Number of attempts: 1 Airway Equipment and Method: Stylet Placement Confirmation: ETT inserted through vocal cords under direct vision, positive ETCO2 and breath sounds checked- equal and bilateral Secured at: 22 cm Tube secured with: Tape Dental Injury: Teeth and Oropharynx as per pre-operative assessment

## 2020-12-13 NOTE — ED Provider Notes (Signed)
[facility] EMERGENCY DEPARTMENT Provider Note   CSN: [id number] Arrival date & time: 12/12/20  1641     History No chief complaint on file.   Scott Mclean is a 75 y.o. male.  Pt presents to the ED today with a right thumb injury.  This occurred around 1430 while using a wood chipper.  Pt initially went to UC and they sent him here.  Unfortunately, he waited several hours to be seen.  Pt denies any other injury.      Past Medical History:  Diagnosis Date   Hypertension     Patient Active Problem List   Diagnosis Date Noted   Radiculopathy 09/13/2018    Past Surgical History:  Procedure Laterality Date   APPENDECTOMY     at age 9   CARPAL TUNNEL RELEASE     Left   ROTATOR CUFF REPAIR     Left   TRANSFORAMINAL LUMBAR INTERBODY FUSION (TLIF) WITH PEDICLE SCREW FIXATION 2 LEVEL Left 09/13/2018   Procedure: LEFT-SIDED LUMBAR FOUR-FIVE, LUMBAR THREE-FOUR TRANSFORAMINAL LUMBAR INTERBODY FUSION WITH INSTRUMENTATION AND ALLOGRAFT;  Surgeon: Phylliss Bob, MD;  Location: [facility];  Service: Orthopedics;  Laterality: Left;       History reviewed. No pertinent family history.  Social History   Tobacco Use   Smoking status: Never   Smokeless tobacco: Never  Vaping Use   Vaping Use: Never used  Substance Use Topics   Alcohol use: Not Currently   Drug use: Never    Home Medications Prior to Admission medications   Medication Sig Start Date End Date Taking? Authorizing Provider  acetaminophen (TYLENOL) 500 MG tablet Take 500 mg by mouth every 6 (six) hours as needed for moderate pain or headache.   Yes [provider]  amLODipine (NORVASC) 10 MG tablet Take 10 mg by mouth every morning. 11/17/20  Yes [provider]  cholecalciferol (VITAMIN D3) 25 MCG (1000 UNIT) tablet Take 2,000 Units by mouth daily.   Yes [provider]  FEXOFENADINE HCL PO Take 1 tablet by mouth daily. Unsure of strength   Yes [provider]  HYDROcodone-acetaminophen (NORCO) 5-325 MG tablet 1-2 tabs po q6 hours prn pain 12/13/20  Yes Leanora Cover, MD  MAGNESIUM PO Take 1 tablet by mouth daily.   Yes [provider]  OVER THE COUNTER MEDICATION Take 1 capsule by mouth daily. Beet root   Yes [provider]  sulfamethoxazole-trimethoprim (BACTRIM DS) 800-160 MG tablet Take 1 tablet by mouth 2 (two) times daily. 12/13/20  Yes Leanora Cover, MD  telmisartan (MICARDIS) 80 MG tablet Take 80 mg by mouth at bedtime. 08/18/18  Yes [provider]  zinc gluconate 50 MG tablet Take 50 mg by mouth daily.   Yes [provider]  amLODipine (NORVASC) 5 MG tablet Take 1 tablet by mouth once daily Patient not taking: Reported on 12/13/2020 08/18/18   Adrian Prows, MD  diazepam (VALIUM) 5 MG tablet Take 1 tablet (5 mg total) by mouth every 6 (six) hours as needed for muscle spasms. Patient not taking: Reported on 12/13/2020 09/14/18   Phylliss Bob, MD    Allergies    Lisinopril and Omeprazole  Review of Systems   Review of Systems  Musculoskeletal:        Right thumb pain  All other systems reviewed and are negative.  Physical Exam Updated Vital Signs BP (!) 143/75   Pulse 75   Temp 97.7 F (36.5 C)   Resp 18  SpO2 91%   Physical Exam Vitals and nursing note reviewed.  Constitutional:      Appearance: Normal appearance.  HENT:     Head: Normocephalic and atraumatic.     Right Ear: External ear normal.     Left Ear: External ear normal.     Nose: Nose normal.     Mouth/Throat:     Mouth: Mucous membranes are moist.     Pharynx: Oropharynx is clear.  Eyes:     Extraocular Movements: Extraocular movements intact.     Conjunctiva/sclera: Conjunctivae normal.     Pupils: Pupils are equal, round, and reactive to light.  Cardiovascular:     Rate and Rhythm: Normal rate and regular rhythm.     Pulses: Normal pulses.     Heart sounds: Normal heart sounds.  Pulmonary:     Effort: Pulmonary  effort is normal.     Breath sounds: Normal breath sounds.  Abdominal:     General: Abdomen is flat. Bowel sounds are normal.     Palpations: Abdomen is soft.  Musculoskeletal:     Cervical back: Normal range of motion and neck supple.     Comments: Right thumb with a distal amputation.  Exposed bone.  Tendons intact.  See picture.  Skin:    General: Skin is warm.     Capillary Refill: Capillary refill takes less than 2 seconds.  Neurological:     General: No focal deficit present.     Mental Status: He is alert and oriented to person, place, and time.  Psychiatric:        Mood and Affect: Mood normal.        Behavior: Behavior normal.     ED Results / Procedures / Treatments   Labs (all labs ordered are listed, but only abnormal results are displayed) Labs Reviewed  RESP PANEL BY RT-PCR (FLU A&B, COVID) ARPGX2    EKG None  Radiology DG Finger Thumb Right  Result Date: 12/12/2020 CLINICAL DATA:  Thumb laceration EXAM: RIGHT THUMB 2+V COMPARISON:  None. FINDINGS: Soft tissue defect noted at the tip of the right thumb. Fracture noted through the tip of the right thumb distal phalanx. Mildly displaced fracture fragments. No subluxation or dislocation. IMPRESSION: Fracture through the mid to distal aspect of the right thumb distal phalanx. Electronically Signed   By: Rolm Baptise M.D.   On: 12/12/2020 19:58    Procedures Procedures   Medications Ordered in ED Medications  bupivacaine (MARCAINE) 0.5 % injection 10 mL (10 mLs Infiltration Given 12/13/20 0115)  Tdap (BOOSTRIX) injection 0.5 mL (0.5 mLs Intramuscular Given 12/13/20 0125)  ceFAZolin (ANCEF) IVPB 2g/100 mL premix (0 g Intravenous Stopped 12/13/20 0235)    ED Course  I have reviewed the triage vital signs and the nursing notes.  Pertinent labs & imaging results that were available during my care of the patient were reviewed by me and considered in my medical decision making (see chart for details).    MDM  Rules/Calculators/A&P                           Pt's thumb digitally blocked.  Pt d/w Dr. Fredna Dow via the [facility] nurse.  Plan for OR.  Pt's tetanus updated and pt given 2 g IV ancef.    Final Clinical Impression(s) / ED Diagnoses Final diagnoses:  Open displaced fracture of distal phalanx of right thumb, initial encounter  Partial traumatic amputation of right thumb through  phalanx, initial encounter    Rx / DC Orders ED Discharge Orders          Ordered    HYDROcodone-acetaminophen (NORCO) 5-325 MG tablet        12/13/20 0443    sulfamethoxazole-trimethoprim (BACTRIM DS) 800-160 MG tablet  2 times daily        12/13/20 0452             Isla Pence, MD 12/13/20 2255

## 2020-12-13 NOTE — ED Notes (Signed)
Pt right thumb wrapped and bacitracin placed

## 2020-12-13 NOTE — Transfer of Care (Signed)
Immediate Anesthesia Transfer of Care Note  Patient: Scott Mclean  Procedure(s) Performed: REVISION AMPUTATION RIGHT THUMB (Right: Finger) Ligation of vessel (Right: Thumb)  Patient Location: PACU  Anesthesia Type:General  Level of Consciousness: awake, oriented and sedated  Airway & Oxygen Therapy: Patient Spontanous Breathing  Post-op Assessment: Report given to RN and Post -op Vital signs reviewed and stable  Post vital signs: Reviewed and stable  Last Vitals:  Vitals Value Taken Time  BP 132/75   Temp 97.5   Pulse 87 12/13/20 0447  Resp 14 12/13/20 0447  SpO2 92 % 12/13/20 0447  Vitals shown include unvalidated device data.  Last Pain:  Vitals:   12/13/20 0122  TempSrc: Oral  PainSc:          Complications: No notable events documented.

## 2020-12-13 NOTE — Anesthesia Preprocedure Evaluation (Addendum)
Anesthesia Evaluation  Patient identified by MRN, date of birth, ID band Patient awake    Reviewed: Allergy & Precautions, H&P , NPO status , Patient's Chart, lab work & pertinent test results  Airway Mallampati: II  TM Distance: >3 FB Neck ROM: Full    Dental no notable dental hx. (+) Teeth Intact, Dental Advisory Given   Pulmonary neg pulmonary ROS,    Pulmonary exam normal breath sounds clear to auscultation       Cardiovascular hypertension, Pt. on medications  Rhythm:Regular Rate:Normal     Neuro/Psych negative neurological ROS  negative psych ROS   GI/Hepatic negative GI ROS, Neg liver ROS,   Endo/Other  negative endocrine ROS  Renal/GU negative Renal ROS  negative genitourinary   Musculoskeletal   Abdominal   Peds  Hematology negative hematology ROS (+)   Anesthesia Other Findings   Reproductive/Obstetrics negative OB ROS                            Anesthesia Physical Anesthesia Plan  ASA: 2 and emergent  Anesthesia Plan: General   Post-op Pain Management:    Induction: Intravenous, Rapid sequence and Cricoid pressure planned  PONV Risk Score and Plan: 3 and Ondansetron, Dexamethasone and Treatment may vary due to age or medical condition  Airway Management Planned: Oral ETT  Additional Equipment:   Intra-op Plan:   Post-operative Plan: Extubation in OR  Informed Consent: I have reviewed the patients History and Physical, chart, labs and discussed the procedure including the risks, benefits and alternatives for the proposed anesthesia with the patient or authorized representative who has indicated his/her understanding and acceptance.     Dental advisory given  Plan Discussed with: CRNA  Anesthesia Plan Comments:         Anesthesia Quick Evaluation

## 2020-12-13 NOTE — ED Notes (Signed)
Pt ambulatory to restroom

## 2020-12-13 NOTE — Op Note (Addendum)
NAME: Scott Mclean MEDICAL RECORD NO: [id number] DATE OF BIRTH: 1945/03/27 FACILITY: [facility] LOCATION: [facility] OR PHYSICIAN: Tennis Must, MD   OPERATIVE REPORT   DATE OF PROCEDURE: 12/13/20    PREOPERATIVE DIAGNOSIS: Right thumb amputation through nail   POSTOPERATIVE DIAGNOSIS: Right thumb amputation through nail   PROCEDURE: 1.  Revision amputation right thumb  2.  Subsequent reexploration of wound for hemostasis prior to extubation  SURGEON:  Leanora Cover, M.D.   ASSISTANT: none   ANESTHESIA:  General   INTRAVENOUS FLUIDS:  Per anesthesia flow sheet.   ESTIMATED BLOOD LOSS:  Minimal.   COMPLICATIONS:  None.   SPECIMENS:  none   TOURNIQUET TIME:   Right arm: 24 minutes at 250 mmHg   DISPOSITION:  Stable to PACU.   INDICATIONS: 75 year old left-hand-dominant male states he amputated the end of his right thumb while using a wood splitter yesterday afternoon.  He was seen at urgent care and sent to the emergency department for further care.  Recommended revision amputation in the operating room.  Risks, benefits and alternatives of surgery were discussed including the risks of blood loss, infection, damage to nerves, vessels, tendons, ligaments, bone for surgery, need for additional surgery, complications with wound healing, continued pain, stiffness.  He voiced understanding of these risks and elected to proceed.  OPERATIVE COURSE:  After being identified preoperatively by myself,  the patient and I agreed on the procedure and site of the procedure.  The surgical site was marked.  Surgical consent had been signed. He was given IV antibiotics as preoperative antibiotic prophylaxis. He was transferred to the operating room and placed on the operating table in supine position with the Right upper extremity on an arm board.  General anesthesia was induced by the anesthesiologist.  Right upper extremity was prepped and draped in normal sterile orthopedic fashion.  A surgical  pause was performed between the surgeons, anesthesia, and operating room staff and all were in agreement as to the patient, procedure, and site of procedure.  Tourniquet at the proximal aspect of the extremity was inflated to 250 mmHg after exsanguination of the arm with an Esmarch bandage.  The wound was examined.  There was no gross contamination.  Was copiously irrigated with sterile saline.  The knife was used to sharply remove any contaminated tissues.  There was a piece of bone toward the ulnar side which was also removed.  The nailbed and germinal matrix were sharply excised with a knife.  The rongeurs were used to shorten the bone to allow soft tissue coverage over the end.  There was a volar fragment which was partially left as it had flexor tendon attachment.  The volar skin flap was able to be placed over the end of the bone and reapproximated to the skin dorsally.  5-0 Monocryl suture was used to reapproximate skin edges.  Good tension-free reapproximation was obtained.  There was overall good contour to the end of the thumb.  Digital block was performed with quarter percent plain Marcaine to aid in postoperative analgesia.  The wound was dressed with sterile Xeroform 4 x 4 and wrapped with a Coban dressing lightly.  An AlumaFoam splint was placed and wrapped lightly with Coban dressing.  The tourniquet was deflated at 24 minutes.  Fingertips were pink with brisk capillary refill after deflation of tourniquet.  The operative  drapes were broken down.  Before the patient had been extubated or transferred off the operating table there was noted be brisk  bleeding through the dressing.  The dressing was taken down and the hand reprepped and draped.  The sutures on the dorsal aspect and ulnar aspect of the wound were taken out.  There was noted to be bleeding from underneath the nail fold.  A small vessel was noted.  Bipolar electrocautery was used to obtain hemostasis.  The wound was examined and bipolar  used to obtain diligent hemostasis throughout the wound.  The skin edges were then reapproximated with 5-0 Monocryl suture.  The wound was dressed with sterile Xeroform 4 x 4's and wrapped with a Coban dressing lightly.  An AlumaFoam splint was placed and wrapped lightly with Coban dressing.  The operative drapes were broken down.  The patient was awoken from anesthesia safely.  He was transferred back to the stretcher and taken to PACU in stable condition.  I will see him back in the office in 1 week for postoperative followup.  I will give him a prescription for Norco 5/325 1-2 tabs PO q6 hours prn pain, dispense # 20.   Leanora Cover, MD Electronically signed, 12/13/20  Addendum: For reexploration of the wound.

## 2020-12-13 NOTE — H&P (Signed)
Scott Mclean is an 75 y.o. male.   Chief Complaint: Right thumb amputation HPI: 75 year old male states he injured his right thumb in a wood splitter yesterday afternoon.  Seen initially at urgent care and sent to [facility] emergency department for further care.  He reports no previous injury to the thumb and no other injuries at this time.  Xrays viewed and interpreted by me: AP lateral oblique views of right thumb show fracture of distal phalanx with loss of tuft. Labs reviewed: None  Allergies:  Allergies  Allergen Reactions   Lisinopril Swelling    Tongue swelling   Omeprazole Other (See Comments)    Upset Stomach    Past Medical History:  Diagnosis Date   Hypertension     Past Surgical History:  Procedure Laterality Date   APPENDECTOMY     at age 53   CARPAL TUNNEL RELEASE     Left   ROTATOR CUFF REPAIR     Left   TRANSFORAMINAL LUMBAR INTERBODY FUSION (TLIF) WITH PEDICLE SCREW FIXATION 2 LEVEL Left 09/13/2018   Procedure: LEFT-SIDED LUMBAR FOUR-FIVE, LUMBAR THREE-FOUR TRANSFORAMINAL LUMBAR INTERBODY FUSION WITH INSTRUMENTATION AND ALLOGRAFT;  Surgeon: Phylliss Bob, MD;  Location: [place];  Service: Orthopedics;  Laterality: Left;    Family History: History reviewed. No pertinent family history.  Social History:   reports that he has never smoked. He has never used smokeless tobacco. He reports that he does not currently use alcohol. He reports that he does not use drugs.  Medications: (Not in a hospital admission)   Results for orders placed or performed during the hospital encounter of 12/12/20 (from the past 48 hour(s))  Resp Panel by RT-PCR (Flu A&B, Covid) Nasopharyngeal Swab     Status: None   Collection Time: 12/13/20  1:11 AM   Specimen: Nasopharyngeal Swab; Nasopharyngeal(NP) swabs in vial transport medium  Result Value Ref Range   SARS Coronavirus 2 by RT PCR NEGATIVE NEGATIVE    Comment: (NOTE) SARS-CoV-2 target nucleic acids are NOT  DETECTED.  The SARS-CoV-2 RNA is generally detectable in upper respiratory specimens during the acute phase of infection. The lowest concentration of SARS-CoV-2 viral copies this assay can detect is 138 copies/mL. A negative result does not preclude SARS-Cov-2 infection and should not be used as the sole basis for treatment or other patient management decisions. A negative result may occur with  improper specimen collection/handling, submission of specimen other than nasopharyngeal swab, presence of viral mutation(s) within the areas targeted by this assay, and inadequate number of viral copies(<138 copies/mL). A negative result must be combined with clinical observations, patient history, and epidemiological information. The expected result is Negative.  Fact Sheet for Patients:  EntrepreneurPulse.com.au  Fact Sheet for Healthcare Providers:  IncredibleEmployment.be  This test is no t yet approved or cleared by the [place] FDA and  has been authorized for detection and/or diagnosis of SARS-CoV-2 by FDA under an Emergency Use Authorization (EUA). This EUA will remain  in effect (meaning this test can be used) for the duration of the COVID-19 declaration under Section 564(b)(1) of the Act, 21 U.S.C.section 360bbb-3(b)(1), unless the authorization is terminated  or revoked sooner.       Influenza A by PCR NEGATIVE NEGATIVE   Influenza B by PCR NEGATIVE NEGATIVE    Comment: (NOTE) The Xpert Xpress SARS-CoV-2/FLU/RSV plus assay is intended as an aid in the diagnosis of influenza from Nasopharyngeal swab specimens and should not be used as a sole basis for treatment.  Nasal washings and aspirates are unacceptable for Xpert Xpress SARS-CoV-2/FLU/RSV testing.  Fact Sheet for Patients: EntrepreneurPulse.com.au  Fact Sheet for Healthcare Providers: IncredibleEmployment.be  This test is not yet approved or  cleared by the [place] FDA and has been authorized for detection and/or diagnosis of SARS-CoV-2 by FDA under an Emergency Use Authorization (EUA). This EUA will remain in effect (meaning this test can be used) for the duration of the COVID-19 declaration under Section 564(b)(1) of the Act, 21 U.S.C. section 360bbb-3(b)(1), unless the authorization is terminated or revoked.  Performed at [facility], [place] [address], [place], [place] [address]     DG Finger Thumb Right  Result Date: 12/12/2020 CLINICAL DATA:  Thumb laceration EXAM: RIGHT THUMB 2+V COMPARISON:  None. FINDINGS: Soft tissue defect noted at the tip of the right thumb. Fracture noted through the tip of the right thumb distal phalanx. Mildly displaced fracture fragments. No subluxation or dislocation. IMPRESSION: Fracture through the mid to distal aspect of the right thumb distal phalanx. Electronically Signed   By: Rolm Baptise M.D.   On: 12/12/2020 19:58      Blood pressure 114/75, pulse 64, temperature 98.1 F (36.7 C), temperature source Oral, resp. rate 18, SpO2 95 %.  General appearance: alert, cooperative, and appears stated age Head: Normocephalic, without obvious abnormality, atraumatic Neck: supple, symmetrical, trachea midline Extremities: Intact sensation and capillary refill all digits.  +epl/fpl/io.  There is amputation of the right thumb through the nail.  He is able to flex and extend the IP joint. Pulses: 2+ and symmetric Skin: Skin color, texture, turgor normal. No rashes or lesions Neurologic: Grossly normal Incision/Wound: As above  Assessment/Plan Right thumb amputation through nail.  Recommend revision amputation and operating room.  We discussed potential ablation of the entire nail.  Risks, benefits and alternatives of surgery were discussed including risks of blood loss, infection, damage to nerves/vessels/tendons/ligament/bone, failure of surgery, need for additional surgery,  complication with wound healing, stiffness.  He voiced understanding of these risks and elected to proceed.    Leanora Cover 12/13/2020, 3:09 AM

## 2020-12-14 NOTE — Anesthesia Postprocedure Evaluation (Signed)
Anesthesia Post Note  Patient: Scott Mclean  Procedure(s) Performed: REVISION AMPUTATION RIGHT THUMB (Right: Thumb) Ligation of vessel (Right: Thumb)     Patient location during evaluation: PACU Anesthesia Type: General Level of consciousness: awake and alert Pain management: pain level controlled Vital Signs Assessment: post-procedure vital signs reviewed and stable Respiratory status: spontaneous breathing, nonlabored ventilation and respiratory function stable Cardiovascular status: blood pressure returned to baseline and stable Postop Assessment: no apparent nausea or vomiting Anesthetic complications: no   No notable events documented.  Last Vitals:  Vitals:   12/13/20 0530 12/13/20 0540  BP: (!) 143/75   Pulse: 78 75  Resp: 15 18  Temp:  36.5 C  SpO2: 93% 91%    Last Pain:  Vitals:   12/13/20 0530  TempSrc:   PainSc: 0-No pain                 Cesily Cuoco,W. EDMOND

## 2020-12-15 ENCOUNTER — Encounter (HOSPITAL_COMMUNITY): Payer: Self-pay | Admitting: Orthopedic Surgery

## 2020-12-22 DIAGNOSIS — S6701XA Crushing injury of right thumb, initial encounter: Secondary | ICD-10-CM | POA: Diagnosis not present

## 2020-12-22 DIAGNOSIS — S68011A Complete traumatic metacarpophalangeal amputation of right thumb, initial encounter: Secondary | ICD-10-CM | POA: Diagnosis not present

## 2021-06-19 DIAGNOSIS — E119 Type 2 diabetes mellitus without complications: Secondary | ICD-10-CM | POA: Diagnosis not present

## 2021-06-19 DIAGNOSIS — E7800 Pure hypercholesterolemia, unspecified: Secondary | ICD-10-CM | POA: Diagnosis not present

## 2021-06-19 DIAGNOSIS — R946 Abnormal results of thyroid function studies: Secondary | ICD-10-CM | POA: Diagnosis not present

## 2021-06-19 DIAGNOSIS — I10 Essential (primary) hypertension: Secondary | ICD-10-CM | POA: Diagnosis not present

## 2021-11-23 DIAGNOSIS — K5901 Slow transit constipation: Secondary | ICD-10-CM | POA: Diagnosis not present

## 2021-11-23 DIAGNOSIS — R1311 Dysphagia, oral phase: Secondary | ICD-10-CM | POA: Diagnosis not present

## 2021-12-02 DIAGNOSIS — R070 Pain in throat: Secondary | ICD-10-CM | POA: Diagnosis not present

## 2021-12-02 DIAGNOSIS — R0981 Nasal congestion: Secondary | ICD-10-CM | POA: Diagnosis not present

## 2021-12-02 DIAGNOSIS — J019 Acute sinusitis, unspecified: Secondary | ICD-10-CM | POA: Diagnosis not present

## 2021-12-07 DIAGNOSIS — R070 Pain in throat: Secondary | ICD-10-CM | POA: Diagnosis not present

## 2021-12-07 DIAGNOSIS — J209 Acute bronchitis, unspecified: Secondary | ICD-10-CM | POA: Diagnosis not present

## 2021-12-08 DIAGNOSIS — E119 Type 2 diabetes mellitus without complications: Secondary | ICD-10-CM | POA: Diagnosis not present

## 2021-12-08 DIAGNOSIS — R946 Abnormal results of thyroid function studies: Secondary | ICD-10-CM | POA: Diagnosis not present

## 2021-12-08 DIAGNOSIS — E7800 Pure hypercholesterolemia, unspecified: Secondary | ICD-10-CM | POA: Diagnosis not present

## 2021-12-08 DIAGNOSIS — I10 Essential (primary) hypertension: Secondary | ICD-10-CM | POA: Diagnosis not present

## 2021-12-29 DIAGNOSIS — G250 Essential tremor: Secondary | ICD-10-CM | POA: Diagnosis not present

## 2021-12-29 DIAGNOSIS — Z0000 Encounter for general adult medical examination without abnormal findings: Secondary | ICD-10-CM | POA: Diagnosis not present

## 2021-12-29 DIAGNOSIS — R7309 Other abnormal glucose: Secondary | ICD-10-CM | POA: Diagnosis not present

## 2021-12-29 DIAGNOSIS — E785 Hyperlipidemia, unspecified: Secondary | ICD-10-CM | POA: Diagnosis not present

## 2021-12-29 DIAGNOSIS — I10 Essential (primary) hypertension: Secondary | ICD-10-CM | POA: Diagnosis not present

## 2021-12-31 DIAGNOSIS — I10 Essential (primary) hypertension: Secondary | ICD-10-CM

## 2021-12-31 DIAGNOSIS — G250 Essential tremor: Secondary | ICD-10-CM

## 2021-12-31 DIAGNOSIS — Z9109 Other allergy status, other than to drugs and biological substances: Secondary | ICD-10-CM | POA: Insufficient documentation

## 2021-12-31 DIAGNOSIS — G5600 Carpal tunnel syndrome, unspecified upper limb: Secondary | ICD-10-CM

## 2021-12-31 DIAGNOSIS — M48061 Spinal stenosis, lumbar region without neurogenic claudication: Secondary | ICD-10-CM

## 2021-12-31 DIAGNOSIS — E785 Hyperlipidemia, unspecified: Secondary | ICD-10-CM

## 2021-12-31 DIAGNOSIS — K449 Diaphragmatic hernia without obstruction or gangrene: Secondary | ICD-10-CM

## 2021-12-31 DIAGNOSIS — K5900 Constipation, unspecified: Secondary | ICD-10-CM

## 2022-01-05 DIAGNOSIS — K649 Unspecified hemorrhoids: Secondary | ICD-10-CM | POA: Diagnosis not present

## 2022-01-05 DIAGNOSIS — D123 Benign neoplasm of transverse colon: Secondary | ICD-10-CM | POA: Diagnosis not present

## 2022-01-05 DIAGNOSIS — D120 Benign neoplasm of cecum: Secondary | ICD-10-CM | POA: Diagnosis not present

## 2022-01-05 DIAGNOSIS — Z09 Encounter for follow-up examination after completed treatment for conditions other than malignant neoplasm: Secondary | ICD-10-CM | POA: Diagnosis not present

## 2022-01-05 DIAGNOSIS — K5730 Diverticulosis of large intestine without perforation or abscess without bleeding: Secondary | ICD-10-CM | POA: Diagnosis not present

## 2022-01-05 DIAGNOSIS — Z86010 Personal history of colonic polyps: Secondary | ICD-10-CM | POA: Diagnosis not present

## 2022-01-07 DIAGNOSIS — D123 Benign neoplasm of transverse colon: Secondary | ICD-10-CM | POA: Diagnosis not present

## 2022-01-07 DIAGNOSIS — D120 Benign neoplasm of cecum: Secondary | ICD-10-CM | POA: Diagnosis not present

## 2022-01-08 ENCOUNTER — Telehealth: Payer: Self-pay | Admitting: *Deleted

## 2022-01-08 ENCOUNTER — Ambulatory Visit: Payer: Medicare Other | Attending: Internal Medicine | Admitting: Internal Medicine

## 2022-01-08 ENCOUNTER — Encounter: Payer: Self-pay | Admitting: Internal Medicine

## 2022-01-08 VITALS — BP 144/84 | HR 52 | Ht 70.0 in | Wt 203.0 lb

## 2022-01-08 DIAGNOSIS — R400 Somnolence: Secondary | ICD-10-CM | POA: Diagnosis not present

## 2022-01-08 DIAGNOSIS — E782 Mixed hyperlipidemia: Secondary | ICD-10-CM

## 2022-01-08 DIAGNOSIS — I10 Essential (primary) hypertension: Secondary | ICD-10-CM | POA: Diagnosis not present

## 2022-01-08 MED ORDER — HYDROCHLOROTHIAZIDE 25 MG PO TABS: 25.0000 mg | ORAL_TABLET | Freq: Every day | ORAL | 3 refills | Status: DC

## 2022-01-08 NOTE — Progress Notes (Signed)
Cardiology Office Note:    Date:  01/08/2022   ID:  Scott Mclean [birth year], MRN [id number]  PCP:  Janie Morning, [facility] [facility] Cardiologist:  Scott Lean, MD     Referring MD: Janie Morning, DO   CC: HTN Consulted for the evaluation of HTN Emergency at the behest of Dr. Theda Sers  History of Present Illness:    Scott Mclean is a 77 y.o. male with a hx of HTN, HLD (117) TSH 1.8.   Patient notes that he is having nocturnal hypertension.   Since 1968 after getting out of the [employer], he has had BP issues.  During COVID-19 had notable weight gain.  Had worsening BP and A1c. Started medication and stopped drinking sugary drinks.  Lost much of the weight and this improved.  Still drinks a lot of coffee and is working to cut back.  Just established at the [place] and started ambulatory BP;  At one point was over 200 .   Has had no chest pain, chest pressure, chest tightness, chest stinging . Patient exertion notable for splitting fire word  and feels no symptoms.    No shortness of breath, DOE .  No PND or orthopnea.  No weight gain, leg swelling , or abdominal swelling.  No syncope or near syncope . Notes  no palpitations or funny heart beats.     Patient reports prior cardiac testing including prior stress testing with Dr. Einar Gip.  Presumed negative  Past Medical History:  Diagnosis Date   Cervicalgia    Encounter for colonoscopy due to history of adenomatous colonic polyps    Essential hypertension    Gastroesophageal reflux disease without esophagitis    Hiatal hernia    Hypertension    Pure hypercholesterolemia    Rib fracture 2015   Thrombosed external hemorrhoid    Trigeminal neuralgia     Past Surgical History:  Procedure Laterality Date   APPENDECTOMY     at age 62   ARTERY EXPLORATION Right 12/13/2020   Procedure: Ligation of vessel;  Surgeon: Leanora Cover, MD;  Location: [facility];  Service: Orthopedics;  Laterality:  Right;   CARPAL TUNNEL RELEASE     Left   COLONOSCOPY     07/2016   I & D EXTREMITY Right 12/13/2020   Procedure: REVISION AMPUTATION RIGHT THUMB;  Surgeon: Leanora Cover, MD;  Location: [facility];  Service: Orthopedics;  Laterality: Right;   left CT release     lt. shoulder sx Left    10/19   lumbar decompression/facetectomy/fusion     Dr. Eda Keys 09/13/2018   right rotator cuff surgery Right 01/31/2020   Dr. Tamera Punt   Right thumb surgery  12/2020   Chain saw accident   [place]     Left   TRANSFORAMINAL LUMBAR INTERBODY FUSION (TLIF) WITH PEDICLE SCREW FIXATION 2 LEVEL Left 09/13/2018   Procedure: LEFT-SIDED LUMBAR FOUR-FIVE, LUMBAR THREE-FOUR TRANSFORAMINAL LUMBAR INTERBODY FUSION WITH INSTRUMENTATION AND ALLOGRAFT;  Surgeon: Phylliss Bob, MD;  Location: [facility];  Service: Orthopedics;  Laterality: Left;    Current Medications: Current Meds  Medication Sig   amLODipine (NORVASC) 5 MG tablet Take 1 tablet by mouth once daily   cholecalciferol (VITAMIN D3) 25 MCG (1000 UNIT) tablet Take 2,000 Units by mouth daily.   ELDERBERRY PO Take 50 mg by mouth daily.   FEXOFENADINE HCL PO Take 1 tablet by mouth daily. Unsure of strength   hydrochlorothiazide (HYDRODIURIL) 25 MG tablet  Take 1 tablet (25 mg total) by mouth daily.   MAGNESIUM PO Take 1 tablet by mouth daily.   Misc Natural Products (YUMVS BEET ROOT-TART CHERRY PO) Take by mouth.   OVER THE COUNTER MEDICATION Take 1 capsule by mouth daily. Beet root   psyllium (METAMUCIL) 58.6 % powder Take 1 packet by mouth daily.   telmisartan (MICARDIS) 80 MG tablet Take 80 mg by mouth at bedtime.   zinc gluconate 50 MG tablet Take 50 mg by mouth daily.   [DISCONTINUED] hydrochlorothiazide (MICROZIDE) 12.5 MG capsule Take 500 mg by mouth daily.     Allergies:   Lisinopril and Omeprazole   Social History   Socioeconomic History   Marital status: Married    Spouse name: Not on file   Number of children: Not on file   Years of  education: Not on file   Highest education level: Not on file  Occupational History   Not on file  Tobacco Use   Smoking status: Never   Smokeless tobacco: Never  Vaping Use   Vaping Use: Never used  Substance and Sexual Activity   Alcohol use: Not Currently   Drug use: Never   Sexual activity: Not on file  Other Topics Concern   Not on file  Social History Narrative   Not on file   Social Determinants of Health   Financial Resource Strain: Not on file  Food Insecurity: Not on file  Transportation Needs: Not on file  Physical Activity: Not on file  Stress: Not on file  Social Connections: Not on file     Family History: The patient's family history includes Appendicitis in his sister; Lung disease in his sister; Prostate cancer in his brother and brother; Throat cancer in his son.  ROS:   Please see the history of present illness.     All other systems reviewed and are negative.  EKGs/Labs/Other Studies Reviewed:    The following studies were reviewed today:  EKG:  EKG is  ordered today.  The ekg ordered today demonstrates 01/08/22: Sinus bradycardia 52 bpm LVH  Recent Labs: No results found for requested labs within last 365 days.  Recent Lipid Panel No results found for: "CHOL", "TRIG", "HDL", "CHOLHDL", "VLDL", "LDLCALC", "LDLDIRECT"   Risk Assessment/Calculations:    HYPERTENSION CONTROL Vitals:   01/08/22 0916 01/08/22 0957  BP: (!) 144/84 (!) 144/84    The patient's blood pressure is elevated above target today.  In order to address the patient's elevated BP: A current anti-hypertensive medication was adjusted today.            Physical Exam:    VS:  BP (!) 144/84   Pulse (!) 52   Ht '5\' 10"'$  (1.778 m)   Wt 203 lb (92.1 kg)   SpO2 95%   BMI 29.13 kg/m     Wt Readings from Last 3 Encounters:  01/08/22 203 lb (92.1 kg)  09/13/18 230 lb (104.3 kg)  09/07/18 231 lb (104.8 kg)    GEN:  Well nourished, well developed in no acute  distress HEENT: Normal NECK: No JVD LYMPHATICS: No lymphadenopathy CARDIAC: no murmur; regular bradycardia, rubs, gallops RESPIRATORY:  Clear to auscultation without rales, wheezing or rhonchi  ABDOMEN: Soft, non-tender, non-distended MUSCULOSKELETAL:  No edema; No deformity  SKIN: Warm and dry NEUROLOGIC:  Alert and oriented x 3 PSYCHIATRIC:  Normal affect   ASSESSMENT:    1. Hypertension, unspecified type   2. Daytime somnolence   3. Mixed hyperlipidemia  PLAN:    HTN HLD  - better controlled on amlodipine 5 mg - I recommend patient return to HCTZ 25 mg (he cut that done to 12.5 mg the last three days because of asymptomatic normotension) - continue telmisartan 80 - If BP elevated at f/u labs would change to chlorthalidone 25 mg - STOPBANG 4 (see below) - We have discussed step wise decrease of caffeine and weaning down to [employer] a day with the goal of going down to rare use  Daytime Somnolence- will get sleep study  STOP-BANG Score for Obstructive Sleep Apnea  RESULT SUMMARY: 4 points STOP-BANG  Intermediate  Risk for moderate to severe OSA  INPUTS: Do you snore loudly? --> 1 = Yes Do you often feel tired, fatigued, or sleepy during the daytime? --> 1 = Yes Has anyone observed you stop breathing during sleep? --> 0 = No Do you have (or are you being treated for) high blood pressure? --> 1 = Yes BMI --> 0 = ?35 kg/m Age --> 0 = ?50 years Neck circumference --> 0 = ?40 cm Gender --> 1 = Male   Nutty Butty       Spring f/u with me  Medication Adjustments/Labs and Tests Ordered: Current medicines are reviewed at length with the patient today.  Concerns regarding medicines are outlined above.  Orders Placed This Encounter  Procedures   Basic metabolic panel   EKG [id number]   [facility] Sleep Study   Meds ordered this encounter  Medications   hydrochlorothiazide (HYDRODIURIL) 25 MG tablet    Sig: Take 1 tablet (25 mg total) by mouth daily.    Dispense:   90 tablet    Refill:  3    Patient Instructions  Medication Instructions:  Your physician has recommended you make the following change in your medication:  INCREASE: hydrochlorothiazide (HCTZ) to 25 mg by mouth once daily   *If you need a refill on your cardiac medications before your next appointment, please call your pharmacy*   Lab Work: IN 1-2 WEEKS: BMP  If you have labs (blood work) drawn today and your tests are completely normal, you will receive your results only by: [place] (if you have MyChart) OR A paper copy in the mail If you have any lab test that is abnormal or we need to change your treatment, we will call you to review the results.   Testing/Procedures: Your physician has requested that you have a Home Sleep Study.    Follow-Up: At [facility], you and your health needs are our priority.  As part of our continuing mission to provide you with exceptional heart care, we have created designated Provider Care Teams.  These Care Teams include your primary Cardiologist (physician) and Advanced Practice Providers (APPs -  Physician Assistants and Nurse Practitioners) who all work together to provide you with the care you need, when you need it.  We recommend signing up for the patient portal called "MyChart".  Sign up information is provided on this After Visit Summary.  MyChart is used to connect with patients for Virtual Visits (Telemedicine).  Patients are able to view lab/test results, encounter notes, upcoming appointments, etc.  Non-urgent messages can be sent to your provider as well.   To learn more about what you can do with MyChart, go to [email or web address].    Your next appointment:   3-4 month(s)  The format for your next appointment:   In Person  Provider:   Werner Lean, MD  Other Instructions Please continue to monitor your Blood Pressure  Important Information About Sugar         Signed, Scott Lean, MD  01/08/2022 10:29 AM    Scott Mclean

## 2022-01-08 NOTE — Telephone Encounter (Signed)
Pt has been seen in the office today by Dr. Gasper Sells who ordered an [facility] sleep study. Pt agreeable to signed waiver and to not open the box until he has been called with the PIN#.

## 2022-01-08 NOTE — Patient Instructions (Signed)
Medication Instructions:  Your physician has recommended you make the following change in your medication:  INCREASE: hydrochlorothiazide (HCTZ) to 25 mg by mouth once daily   *If you need a refill on your cardiac medications before your next appointment, please call your pharmacy*   Lab Work: IN 1-2 WEEKS: BMP  If you have labs (blood work) drawn today and your tests are completely normal, you will receive your results only by: [place] (if you have MyChart) OR A paper copy in the mail If you have any lab test that is abnormal or we need to change your treatment, we will call you to review the results.   Testing/Procedures: Your physician has requested that you have a Home Sleep Study.    Follow-Up: At [facility], you and your health needs are our priority.  As part of our continuing mission to provide you with exceptional heart care, we have created designated Provider Care Teams.  These Care Teams include your primary Cardiologist (physician) and Advanced Practice Providers (APPs -  Physician Assistants and Nurse Practitioners) who all work together to provide you with the care you need, when you need it.  We recommend signing up for the patient portal called "MyChart".  Sign up information is provided on this After Visit Summary.  MyChart is used to connect with patients for Virtual Visits (Telemedicine).  Patients are able to view lab/test results, encounter notes, upcoming appointments, etc.  Non-urgent messages can be sent to your provider as well.   To learn more about what you can do with MyChart, go to [email or web address].    Your next appointment:   3-4 month(s)  The format for your next appointment:   In Person  Provider:   Werner Lean, MD     Other Instructions Please continue to monitor your Blood Pressure  Important Information About Sugar

## 2022-01-12 NOTE — Telephone Encounter (Signed)
Prior Authorization for [facility] sent to [facility] via Phone. Reference # . NO PA REQ

## 2022-01-12 NOTE — Telephone Encounter (Signed)
Pt has been made aware he can proceed with [facility] sleep study. Pt has been given PIN# [id number].   Called and made the patient aware that he may proceed with the [facility] Sleep Study. PIN # provided to the patient. Patient made aware that he will be contacted after the test has been read with the results and any recommendations. Patient verbalized understanding and thanked me for the call.

## 2022-01-13 ENCOUNTER — Encounter (INDEPENDENT_AMBULATORY_CARE_PROVIDER_SITE_OTHER): Payer: Medicare Other | Admitting: Cardiology

## 2022-01-13 DIAGNOSIS — G4733 Obstructive sleep apnea (adult) (pediatric): Secondary | ICD-10-CM | POA: Diagnosis not present

## 2022-01-14 ENCOUNTER — Ambulatory Visit: Payer: Medicare Other | Attending: Internal Medicine

## 2022-01-14 DIAGNOSIS — R400 Somnolence: Secondary | ICD-10-CM

## 2022-01-14 NOTE — Procedures (Signed)
SLEEP STUDY REPORT Patient Information Study Date: 01/13/2022 Patient Name: Scott Mclean Patient ID: [id number] Birth Date: 02/11/1945 Age: 77 Gender: Male BMI: 29.0 (W=203 lb, H=5' 10'') Stopbang: 4 Referring Physician: Rudean Haskell, MD  TEST DESCRIPTION: Home sleep apnea testing was completed using the WatchPat, a Type 1 device, utilizing peripheral arterial tonometry (PAT), chest movement, actigraphy, pulse oximetry, pulse rate, body position and snore. AHI was calculated with apnea and hypopnea using valid sleep time as the denominator. RDI includes apneas, hypopneas, and RERAs. The data acquired and the scoring of sleep and all associated events were performed in accordance with the recommended standards and specifications as outlined in the AASM Manual for the Scoring of Sleep and Associated Events 2.2.0 (2015).   FINDINGS:   1. Mild Obstructive Sleep Apnea with AHI 9.7/hr.   2. No Central Sleep Apnea with pAHIc 0.5/hr.   3. Oxygen desaturations as low as 85%.   4. Mild snoring was present. O2 sats were < 88% for 1.2 min.   5. Total sleep time was 6 hrs and 11 min.   6. 24.1% of total sleep time was spent in REM sleep.   7. Shortened sleep onset latency at 5 min.   8. Prolonged REM sleep onset latency at 199 min.   9. Total awakenings were 10.  10. Arrhythmia detection:  None.  DIAGNOSIS: Mild Obstructive Sleep Apnea (G47.33)  RECOMMENDATIONS:   1.  Clinical correlation of these findings is necessary.  The decision to treat obstructive sleep apnea (OSA) is usually based on the presence of apnea symptoms or the presence of associated medical conditions such as Hypertension, Congestive Heart Failure, Atrial Fibrillation or Obesity.  The most common symptoms of OSA are snoring, gasping for breath while sleeping, daytime sleepiness and fatigue.   2.  Initiating apnea therapy is recommended given the presence of symptoms and/or associated conditions. Recommend  proceeding with one of the following:     a.  Auto-CPAP therapy with a pressure range of 5-20cm H2O.     b.  An oral appliance (OA) that can be obtained from certain dentists with expertise in sleep medicine.  These are primarily of use in non-obese patients with mild and moderate disease.     c.  An ENT consultation which may be useful to look for specific causes of obstruction and possible treatment options.     d.  If patient is intolerant to PAP therapy, consider referral to ENT for evaluation for hypoglossal nerve stimulator.   3.  Close follow-up is necessary to ensure success with CPAP or oral appliance therapy for maximum benefit.  4.  A follow-up oximetry study on CPAP is recommended to assess the adequacy of therapy and determine the need for supplemental oxygen or the potential need for Bi-level therapy.  An arterial blood gas to determine the adequacy of baseline ventilation and oxygenation should also be considered.  5.  Healthy sleep recommendations include:  adequate nightly sleep (normal 7-9 hrs/night), avoidance of caffeine after noon and alcohol near bedtime, and maintaining a sleep environment that is cool, dark and quiet.  6.  Weight loss for overweight patients is recommended.  Even modest amounts of weight loss can significantly improve the severity of sleep apnea.  7.  Snoring recommendations include:  weight loss where appropriate, side sleeping, and avoidance of alcohol before bed.  8.  Operation of motor vehicle should be avoided when sleepy.  Signature:   Fransico Him, MD; [facility]; Diplomat,  American Board of Sleep Medicine Electronically Signed: 01/14/2022

## 2022-01-15 ENCOUNTER — Ambulatory Visit: Payer: Medicare Other | Attending: Internal Medicine

## 2022-01-15 DIAGNOSIS — I10 Essential (primary) hypertension: Secondary | ICD-10-CM

## 2022-01-15 LAB — BASIC METABOLIC PANEL
BUN/Creatinine Ratio: 22 (ref 10–24)
BUN: 23 mg/dL (ref 8–27)
CO2: 26 mmol/L (ref 20–29)
Calcium: 10 mg/dL (ref 8.6–10.2)
Chloride: 98 mmol/L (ref 96–106)
Creatinine, Ser: 1.06 mg/dL (ref 0.76–1.27)
Glucose: 123 mg/dL — ABNORMAL HIGH (ref 70–99)
Potassium: 4.2 mmol/L (ref 3.5–5.2)
Sodium: 139 mmol/L (ref 134–144)
eGFR: 73 mL/min/{1.73_m2} (ref >59)

## 2022-01-19 ENCOUNTER — Telehealth: Payer: Self-pay | Admitting: *Deleted

## 2022-01-19 DIAGNOSIS — R400 Somnolence: Secondary | ICD-10-CM

## 2022-01-19 DIAGNOSIS — G4733 Obstructive sleep apnea (adult) (pediatric): Secondary | ICD-10-CM

## 2022-01-19 DIAGNOSIS — I10 Essential (primary) hypertension: Secondary | ICD-10-CM

## 2022-01-19 NOTE — Telephone Encounter (Addendum)
The patient has been notified of the result and verbalized understanding.  All questions (if any) were answered. Marolyn Hammock, CMA 01/19/2022 6:45 PM    Order overnight pulse ox on CPAP

## 2022-01-19 NOTE — Telephone Encounter (Signed)
-----  Message from Lauralee Evener, [place] sent at 01/14/2022  3:26 PM EST -----  ----- Message ----- From: Sueanne Margarita, MD Sent: 01/14/2022  11:23 AM EST To: [facility] Sleep Studies  Please let patient know that they have sleep apnea and recommend treating with CPAP.  Please order an auto CPAP from 4-15cm H2O with heated humidity and mask of choice.  Order overnight pulse ox on CPAP.  Followup with me in 6 weeks.

## 2022-03-26 LAB — LAB REPORT - SCANNED: EGFR: 73

## 2022-05-06 NOTE — Progress Notes (Signed)
Cardiology Office Note:    Date:  05/07/2022   ID:  Scott Mclean, Scott Mclean 08-29-1945, MRN [id number]  PCP:  Irena Reichmann, DO   [facility] Providers Cardiologist:  Christell Constant, MD     Referring MD: Irena Reichmann, DO   CC: HTN f/u  History of Present Illness:    Scott Mclean is a 77 y.o. male with a hx of HTN, HLD (117) TSH 1.8.  2023: had HTN urgency.  Started therapy, dietary changes, and OSA treatment.  Patient notes that he is doing well.   Since last visit notes that his blood sugars have been high . There are no interval hospital/ED visit.    No chest pain or pressure .  No SOB/DOE and no PND/Orthopnea.  No weight gain or leg swelling.  No palpitations or syncope .  Ambulatory blood pressure 120127 systolic.   Past Medical History:  Diagnosis Date   Cervicalgia    Encounter for colonoscopy due to history of adenomatous colonic polyps    Essential hypertension    Gastroesophageal reflux disease without esophagitis    Hiatal hernia    Hypertension    OSA (obstructive sleep apnea) 05/07/2022   Pure hypercholesterolemia    Rib fracture 2015   Thrombosed external hemorrhoid    Trigeminal neuralgia     Past Surgical History:  Procedure Laterality Date   APPENDECTOMY     at age 61   ARTERY EXPLORATION Right 12/13/2020   Procedure: Ligation of vessel;  Surgeon: Betha Loa, MD;  Location: [facility] OR;  Service: Orthopedics;  Laterality: Right;   CARPAL TUNNEL RELEASE     Left   COLONOSCOPY     07/2016   I & D EXTREMITY Right 12/13/2020   Procedure: REVISION AMPUTATION RIGHT THUMB;  Surgeon: Betha Loa, MD;  Location: [facility] OR;  Service: Orthopedics;  Laterality: Right;   left CT release     lt. shoulder sx Left    10/19   lumbar decompression/facetectomy/fusion     Dr. Royston Cowper 09/13/2018   right rotator cuff surgery Right 01/31/2020   Dr. Ave Filter   Right thumb surgery  12/2020   Chain saw accident   ROTATOR CUFF REPAIR     Left    TRANSFORAMINAL LUMBAR INTERBODY FUSION (TLIF) WITH PEDICLE SCREW FIXATION 2 LEVEL Left 09/13/2018   Procedure: LEFT-SIDED LUMBAR FOUR-FIVE, LUMBAR THREE-FOUR TRANSFORAMINAL LUMBAR INTERBODY FUSION WITH INSTRUMENTATION AND ALLOGRAFT;  Surgeon: Estill Bamberg, MD;  Location: [facility] OR;  Service: Orthopedics;  Laterality: Left;    Current Medications: Current Meds  Medication Sig   acetaminophen (TYLENOL) 500 MG tablet Take by mouth as needed for moderate pain or headache.   amLODipine (NORVASC) 5 MG tablet Take 1 tablet by mouth once daily   cholecalciferol (VITAMIN D3) 25 MCG (1000 UNIT) tablet Take 2,000 Units by mouth daily.   ELDERBERRY PO Take 50 mg by mouth daily.   FEXOFENADINE HCL PO Take 1 tablet by mouth daily. Unsure of strength   hydrochlorothiazide (HYDRODIURIL) 25 MG tablet Take 1 tablet (25 mg total) by mouth daily.   MAGNESIUM PO Take 1 tablet by mouth daily.   Misc Natural Products (YUMVS BEET ROOT-TART CHERRY PO) Take by mouth.   OVER THE COUNTER MEDICATION Take 1 capsule by mouth daily. Beet root   psyllium (METAMUCIL) 58.6 % powder Take 1 packet by mouth daily.   telmisartan (MICARDIS) 80 MG tablet Take 80 mg by mouth at bedtime.   zinc gluconate 50 MG tablet Take  50 mg by mouth daily.     Allergies:   Lisinopril and Omeprazole   Social History   Socioeconomic History   Marital status: Married    Spouse name: Not on file   Number of children: Not on file   Years of education: Not on file   Highest education level: Not on file  Occupational History   Not on file  Tobacco Use   Smoking status: Never   Smokeless tobacco: Never  Vaping Use   Vaping Use: Never used  Substance and Sexual Activity   Alcohol use: Not Currently   Drug use: Never   Sexual activity: Not on file  Other Topics Concern   Not on file  Social History Narrative   Not on file   Social Determinants of Health   Financial Resource Strain: Not on file  Food Insecurity: Not on file   Transportation Needs: Not on file  Physical Activity: Not on file  Stress: Not on file  Social Connections: Not on file     Family History: The patient's family history includes Appendicitis in his sister; Lung disease in his sister; Prostate cancer in his brother and brother; Throat cancer in his son.  ROS:   Please see the history of present illness.     All other systems reviewed and are negative.  EKGs/Labs/Other Studies Reviewed:    The following studies were reviewed today:  EKG:  01/08/22: Sinus bradycardia 52 bpm LVH  Recent Labs: 01/15/2022: BUN 23; Creatinine, Ser 1.06; Potassium 4.2; Sodium 139  Recent Lipid Panel No results found for: "CHOL", "TRIG", "HDL", "CHOLHDL", "VLDL", "LDLCALC", "LDLDIRECT"   Physical Exam:    VS:  BP (!) 140/60   Pulse 61   Ht 5\' 10"  (1.778 m)   Wt 209 lb 3.2 oz (94.9 kg)   SpO2 96%   BMI 30.02 kg/m     Wt Readings from Last 3 Encounters:  05/07/22 209 lb 3.2 oz (94.9 kg)  01/08/22 203 lb (92.1 kg)  09/13/18 230 lb (104.3 kg)    GEN:  Well nourished, well developed in no acute distress HEENT: Normal CARDIAC: no murmur; regular rhythm, rubs, gallops RESPIRATORY:  Clear to auscultation without rales, wheezing or rhonchi  ABDOMEN: Soft, non-tender, non-distended MUSCULOSKELETAL:  No edema; No deformity  SKIN: Warm and dry NEUROLOGIC:  Alert and oriented x 3 PSYCHIATRIC:  Normal affect   ASSESSMENT:    1. Mixed hyperlipidemia   2. Hypertension, unspecified type   3. OSA (obstructive sleep apnea)     PLAN:    HTN - BP is above goal; orrs HCTZ-> chlorthalidone; he will send me his BP in two weeks and his daughter will confirm cuff works well - continue telmisartan 80  HLD  -  Unamenable to medications at this time -  offered CAC score - we created a diet plan for him and his wife using the Mediterranean diet - Atc at next blood draw  OSA on CPAP - see Dr. Mayford Knife; I recommend CPAP  - [facility]     One year with me  Medication Adjustments/Labs and Tests Ordered: Current medicines are reviewed at length with the patient today.  Concerns regarding medicines are outlined above.  Orders Placed This Encounter  Procedures   CT CARDIAC SCORING (SELF PAY ONLY)   No orders of the defined types were placed in this encounter.   Patient Instructions  Medication Instructions:  Your physician recommends that you  continue on your current medications as directed. Please refer to the Current Medication list given to you today.  *If you need a refill on your cardiac medications before your next appointment, please call your pharmacy*   Lab Work: NONE If you have labs (blood work) drawn today and your tests are completely normal, you will receive your results only by: MyChart Message (if you have MyChart) OR A paper copy in the mail If you have any lab test that is abnormal or we need to change your treatment, we will call you to review the results.   Testing/Procedures: Your physician has ordered you to have non-invasive CT for Calcium Scoring of your heart. It will calculate your risk of developing Coronary Artery Disease (CAD) by measuring the amount of buildup of calcium in the plaque in the coronary arteries (arteries surrounding your heart).     Follow-Up: At [facility], you and your health needs are our priority.  As part of our continuing mission to provide you with exceptional heart care, we have created designated Provider Care Teams.  These Care Teams include your primary Cardiologist (physician) and Advanced Practice Providers (APPs -  Physician Assistants and Nurse Practitioners) who all work together to provide you with the care you need, when you need it.  We recommend signing up for the patient portal called "MyChart".  Sign up information is provided on this After Visit Summary.  MyChart is used to connect with patients for Virtual Visits  (Telemedicine).  Patients are able to view lab/test results, encounter notes, upcoming appointments, etc.  Non-urgent messages can be sent to your provider as well.   To learn more about what you can do with MyChart, go to [email or web address].    Your next appointment:   1 year(s)  Provider:   Christell Constant, MD     Other Instructions Please monitor your BP as discussed with Dr. Izora Ribas.  Please consider using a CPAP machine or similar to treat Sleep Apnea.   [email or web address] meal planning    Signed, Christell Constant, MD  05/07/2022 10:18 AM    [facility]

## 2022-05-07 ENCOUNTER — Encounter: Payer: Self-pay | Admitting: Internal Medicine

## 2022-05-07 ENCOUNTER — Ambulatory Visit: Payer: Medicare Other | Attending: Internal Medicine | Admitting: Internal Medicine

## 2022-05-07 VITALS — BP 140/60 | HR 61 | Ht 70.0 in | Wt 209.2 lb

## 2022-05-07 DIAGNOSIS — G4733 Obstructive sleep apnea (adult) (pediatric): Secondary | ICD-10-CM | POA: Diagnosis not present

## 2022-05-07 DIAGNOSIS — E782 Mixed hyperlipidemia: Secondary | ICD-10-CM | POA: Diagnosis not present

## 2022-05-07 DIAGNOSIS — I10 Essential (primary) hypertension: Secondary | ICD-10-CM | POA: Diagnosis not present

## 2022-05-07 NOTE — Patient Instructions (Signed)
Medication Instructions:  Your physician recommends that you continue on your current medications as directed. Please refer to the Current Medication list given to you today.  *If you need a refill on your cardiac medications before your next appointment, please call your pharmacy*   Lab Work: NONE If you have labs (blood work) drawn today and your tests are completely normal, you will receive your results only by: MyChart Message (if you have MyChart) OR A paper copy in the mail If you have any lab test that is abnormal or we need to change your treatment, we will call you to review the results.   Testing/Procedures: Your physician has ordered you to have non-invasive CT for Calcium Scoring of your heart. It will calculate your risk of developing Coronary Artery Disease (CAD) by measuring the amount of buildup of calcium in the plaque in the coronary arteries (arteries surrounding your heart).     Follow-Up: At [facility], you and your health needs are our priority.  As part of our continuing mission to provide you with exceptional heart care, we have created designated Provider Care Teams.  These Care Teams include your primary Cardiologist (physician) and Advanced Practice Providers (APPs -  Physician Assistants and Nurse Practitioners) who all work together to provide you with the care you need, when you need it.  We recommend signing up for the patient portal called "MyChart".  Sign up information is provided on this After Visit Summary.  MyChart is used to connect with patients for Virtual Visits (Telemedicine).  Patients are able to view lab/test results, encounter notes, upcoming appointments, etc.  Non-urgent messages can be sent to your provider as well.   To learn more about what you can do with MyChart, go to [email or web address].    Your next appointment:   1 year(s)  Provider:   Christell Constant, MD     Other Instructions Please monitor your BP as  discussed with Dr. Izora Ribas.  Please consider using a CPAP machine or similar to treat Sleep Apnea.   [email or web address] meal planning

## 2022-05-07 NOTE — Addendum Note (Signed)
Addended by: Reesa Chew on: 05/07/2022 09:10 AM   Modules accepted: Orders

## 2022-05-29 DIAGNOSIS — R509 Fever, unspecified: Secondary | ICD-10-CM | POA: Diagnosis not present

## 2022-05-29 DIAGNOSIS — J324 Chronic pansinusitis: Secondary | ICD-10-CM | POA: Diagnosis not present

## 2022-05-29 DIAGNOSIS — R0981 Nasal congestion: Secondary | ICD-10-CM | POA: Diagnosis not present

## 2022-05-29 DIAGNOSIS — M791 Myalgia, unspecified site: Secondary | ICD-10-CM | POA: Diagnosis not present

## 2022-05-29 DIAGNOSIS — R051 Acute cough: Secondary | ICD-10-CM | POA: Diagnosis not present

## 2022-06-03 ENCOUNTER — Ambulatory Visit (HOSPITAL_BASED_OUTPATIENT_CLINIC_OR_DEPARTMENT_OTHER)
Admission: RE | Admit: 2022-06-03 | Discharge: 2022-06-03 | Disposition: A | Discharge status: Home / Self Care | Payer: Medicare Other | Source: Ambulatory Visit | Attending: Internal Medicine | Admitting: Internal Medicine

## 2022-06-03 DIAGNOSIS — E782 Mixed hyperlipidemia: Secondary | ICD-10-CM | POA: Insufficient documentation

## 2022-06-03 DIAGNOSIS — I10 Essential (primary) hypertension: Secondary | ICD-10-CM | POA: Insufficient documentation

## 2022-06-04 ENCOUNTER — Telehealth: Payer: Self-pay

## 2022-06-04 DIAGNOSIS — E782 Mixed hyperlipidemia: Secondary | ICD-10-CM

## 2022-06-04 NOTE — Telephone Encounter (Signed)
-----   Message from Christell Constant, MD sent at 06/04/2022  9:22 AM EDT ----- Results: Elevated calcium score Plan: We discussed diet changes at length at our last visit; Once he has been on this diet for 3 months, we should recheck fasting lipids; if still elevated will then need medication assistance.  Christell Constant, MD

## 2022-06-04 NOTE — Telephone Encounter (Signed)
The patient has been notified of the result and verbalized understanding.  All questions (if any) were answered. Arvid Right Ramiel Forti, RN 06/04/2022 11:33 AM   Pt will come in for fasting blood work on 09/06/22.

## 2022-06-21 ENCOUNTER — Telehealth: Payer: Self-pay

## 2022-06-21 NOTE — Telephone Encounter (Signed)
Daughter reports PCP told pt to increase amlodipine to 10 mg PO every day.  Will send pt concern to MD to address.

## 2022-06-21 NOTE — Telephone Encounter (Signed)
Pt daughter gave RN pt recent BP log.  Reports pt c/o dizziness especially when changing positions.  Also, had an episode on 6/15 at 11:30 am 101/66-62 pt reorts felt tired hot and sweaty. RN reviewed medications pt reports taking to 05/07/22 AVS.  Pt reports takes amlodipine 10 mg AVS reports 5 mg.  Daughter to reach out to pt to find out correct dose of amlodipine.

## 2022-06-22 ENCOUNTER — Other Ambulatory Visit: Payer: Self-pay | Admitting: Internal Medicine

## 2022-06-22 MED ORDER — AMLODIPINE BESYLATE 5 MG PO TABS: 5.0000 mg | ORAL_TABLET | Freq: Every day | ORAL | 3 refills | Status: AC

## 2022-06-22 NOTE — Telephone Encounter (Signed)
Called pt advised of MD recommendation: We should go back to the dose that work best for him; 5 mg.  Monitor BP.   Refill of amlodipine 5 mg sent to pharmacy on file.  Pt reports has an OV with PCP next week.  Will continue to monitor BP and notify office if continues to run low and notes dizziness/ lightheadedness.  Or will have daughter notify MD.  Pt had no questions/concerns.

## 2022-06-23 DIAGNOSIS — I10 Essential (primary) hypertension: Secondary | ICD-10-CM | POA: Diagnosis not present

## 2022-06-23 DIAGNOSIS — E785 Hyperlipidemia, unspecified: Secondary | ICD-10-CM | POA: Diagnosis not present

## 2022-06-23 DIAGNOSIS — R7309 Other abnormal glucose: Secondary | ICD-10-CM | POA: Diagnosis not present

## 2022-06-30 DIAGNOSIS — R634 Abnormal weight loss: Secondary | ICD-10-CM | POA: Diagnosis not present

## 2022-06-30 DIAGNOSIS — Z79899 Other long term (current) drug therapy: Secondary | ICD-10-CM | POA: Diagnosis not present

## 2022-06-30 DIAGNOSIS — R946 Abnormal results of thyroid function studies: Secondary | ICD-10-CM | POA: Diagnosis not present

## 2022-06-30 DIAGNOSIS — I10 Essential (primary) hypertension: Secondary | ICD-10-CM | POA: Diagnosis not present

## 2022-06-30 DIAGNOSIS — E118 Type 2 diabetes mellitus with unspecified complications: Secondary | ICD-10-CM | POA: Diagnosis not present

## 2022-07-03 DIAGNOSIS — M791 Myalgia, unspecified site: Secondary | ICD-10-CM | POA: Diagnosis not present

## 2022-07-03 DIAGNOSIS — J309 Allergic rhinitis, unspecified: Secondary | ICD-10-CM | POA: Diagnosis not present

## 2022-07-03 DIAGNOSIS — J0110 Acute frontal sinusitis, unspecified: Secondary | ICD-10-CM | POA: Diagnosis not present

## 2022-08-03 ENCOUNTER — Telehealth: Payer: Self-pay

## 2022-08-03 DIAGNOSIS — E782 Mixed hyperlipidemia: Secondary | ICD-10-CM

## 2022-08-03 NOTE — Telephone Encounter (Signed)
Pt is requesting to have f/u lab work drawn at a [facility] closer to home.  Updated order for FLP to be drawn at [facility].  Advised pt he will have to contact [facility] to determine if an appointment is needed.  Pt advised labs were scheduled with our office on 09/07/22.  Pt will go to [employer] [employer] around this date.  Also advised to fast prior.   All questions and concerns answered.

## 2022-08-11 ENCOUNTER — Other Ambulatory Visit: Payer: Self-pay

## 2022-08-11 DIAGNOSIS — E782 Mixed hyperlipidemia: Secondary | ICD-10-CM

## 2022-08-15 DIAGNOSIS — L237 Allergic contact dermatitis due to plants, except food: Secondary | ICD-10-CM | POA: Diagnosis not present

## 2022-08-15 DIAGNOSIS — R21 Rash and other nonspecific skin eruption: Secondary | ICD-10-CM | POA: Diagnosis not present

## 2022-09-02 DIAGNOSIS — R21 Rash and other nonspecific skin eruption: Secondary | ICD-10-CM | POA: Diagnosis not present

## 2022-09-02 DIAGNOSIS — L237 Allergic contact dermatitis due to plants, except food: Secondary | ICD-10-CM | POA: Diagnosis not present

## 2022-09-07 ENCOUNTER — Other Ambulatory Visit: Payer: Medicare Other

## 2022-09-07 DIAGNOSIS — E782 Mixed hyperlipidemia: Secondary | ICD-10-CM | POA: Diagnosis not present

## 2022-09-13 ENCOUNTER — Telehealth: Payer: Self-pay

## 2022-09-13 DIAGNOSIS — E782 Mixed hyperlipidemia: Secondary | ICD-10-CM

## 2022-09-13 NOTE — Telephone Encounter (Signed)
The patient has been notified of the result and verbalized understanding.  All questions (if any) were answered. Arvid Right Lezette Kitts, RN 09/13/2022 4:13 PM    Pt reports did not fast prior to lab draw.  Would like to have repeat lab draw before starting new medication.  Advised pt I have placed order and released pt will need to go to a [facility] near home to have labs drawn.  Pt is agreeable to plan no questions at this time.

## 2022-09-13 NOTE — Telephone Encounter (Signed)
-----   Message from Christell Constant sent at 09/13/2022  1:04 PM EDT ----- He may repeat labs fasting if he wishes.  I would favor getting his started on heart attack prevention therapy. ----- Message ----- From: Macie Burows, RN Sent: 09/13/2022  12:15 PM EDT To: Christell Constant, MD  Pt was not fasting prior to lab draw.  Would you like pt to have repeat labs or go ahead and start medication?  Alena Bills, RN ----- Message ----- From: Christell Constant, MD Sent: 09/10/2022   4:42 PM EDT To: Macie Burows, RN  Results: LDL above goal for CAC scan despite diet Plan: Rosuvastatin 5 mg and labs in three months  Christell Constant, MD

## 2022-10-11 DIAGNOSIS — E782 Mixed hyperlipidemia: Secondary | ICD-10-CM | POA: Diagnosis not present

## 2022-10-12 LAB — LIPID PANEL
Chol/HDL Ratio: 3.7 {ratio} (ref 0.0–5.0)
Cholesterol, Total: 157 mg/dL (ref 100–199)
HDL: 43 mg/dL (ref >39)
LDL Chol Calc (NIH): 103 mg/dL — ABNORMAL HIGH (ref 0–99)
Triglycerides: 51 mg/dL (ref 0–149)
VLDL Cholesterol Cal: 11 mg/dL (ref 5–40)

## 2022-12-27 DIAGNOSIS — E118 Type 2 diabetes mellitus with unspecified complications: Secondary | ICD-10-CM | POA: Diagnosis not present

## 2022-12-27 DIAGNOSIS — I10 Essential (primary) hypertension: Secondary | ICD-10-CM | POA: Diagnosis not present

## 2022-12-27 DIAGNOSIS — R946 Abnormal results of thyroid function studies: Secondary | ICD-10-CM | POA: Diagnosis not present

## 2022-12-27 DIAGNOSIS — Z79899 Other long term (current) drug therapy: Secondary | ICD-10-CM | POA: Diagnosis not present

## 2023-01-04 DIAGNOSIS — Z0000 Encounter for general adult medical examination without abnormal findings: Secondary | ICD-10-CM | POA: Diagnosis not present

## 2023-01-04 DIAGNOSIS — Z9109 Other allergy status, other than to drugs and biological substances: Secondary | ICD-10-CM | POA: Diagnosis not present

## 2023-01-04 DIAGNOSIS — Z5329 Procedure and treatment not carried out because of patient's decision for other reasons: Secondary | ICD-10-CM | POA: Diagnosis not present

## 2023-01-19 DIAGNOSIS — R0981 Nasal congestion: Secondary | ICD-10-CM | POA: Diagnosis not present

## 2023-01-19 DIAGNOSIS — J040 Acute laryngitis: Secondary | ICD-10-CM | POA: Diagnosis not present

## 2023-01-19 DIAGNOSIS — J302 Other seasonal allergic rhinitis: Secondary | ICD-10-CM | POA: Diagnosis not present

## 2023-01-22 DIAGNOSIS — R051 Acute cough: Secondary | ICD-10-CM | POA: Diagnosis not present

## 2023-01-22 DIAGNOSIS — R0981 Nasal congestion: Secondary | ICD-10-CM | POA: Diagnosis not present

## 2023-02-06 DIAGNOSIS — J069 Acute upper respiratory infection, unspecified: Secondary | ICD-10-CM | POA: Diagnosis not present

## 2023-02-06 DIAGNOSIS — R051 Acute cough: Secondary | ICD-10-CM | POA: Diagnosis not present

## 2023-02-08 DIAGNOSIS — R051 Acute cough: Secondary | ICD-10-CM | POA: Diagnosis not present

## 2023-02-08 DIAGNOSIS — R0981 Nasal congestion: Secondary | ICD-10-CM | POA: Diagnosis not present

## 2023-02-08 DIAGNOSIS — R5381 Other malaise: Secondary | ICD-10-CM | POA: Diagnosis not present

## 2023-02-12 DIAGNOSIS — J209 Acute bronchitis, unspecified: Secondary | ICD-10-CM | POA: Diagnosis not present

## 2023-02-12 DIAGNOSIS — R0981 Nasal congestion: Secondary | ICD-10-CM | POA: Diagnosis not present

## 2023-02-12 DIAGNOSIS — R051 Acute cough: Secondary | ICD-10-CM | POA: Diagnosis not present

## 2023-02-13 ENCOUNTER — Other Ambulatory Visit: Payer: Self-pay | Admitting: Internal Medicine

## 2023-06-21 DIAGNOSIS — W57XXXA Bitten or stung by nonvenomous insect and other nonvenomous arthropods, initial encounter: Secondary | ICD-10-CM | POA: Diagnosis not present

## 2023-06-21 DIAGNOSIS — R21 Rash and other nonspecific skin eruption: Secondary | ICD-10-CM | POA: Diagnosis not present

## 2023-06-27 DIAGNOSIS — I10 Essential (primary) hypertension: Secondary | ICD-10-CM | POA: Diagnosis not present

## 2023-06-27 DIAGNOSIS — E118 Type 2 diabetes mellitus with unspecified complications: Secondary | ICD-10-CM | POA: Diagnosis not present

## 2023-06-27 DIAGNOSIS — E785 Hyperlipidemia, unspecified: Secondary | ICD-10-CM | POA: Diagnosis not present

## 2023-07-04 DIAGNOSIS — R946 Abnormal results of thyroid function studies: Secondary | ICD-10-CM | POA: Diagnosis not present

## 2023-07-04 DIAGNOSIS — E785 Hyperlipidemia, unspecified: Secondary | ICD-10-CM | POA: Diagnosis not present

## 2023-07-04 DIAGNOSIS — I10 Essential (primary) hypertension: Secondary | ICD-10-CM | POA: Diagnosis not present

## 2023-07-04 DIAGNOSIS — E118 Type 2 diabetes mellitus with unspecified complications: Secondary | ICD-10-CM | POA: Diagnosis not present

## 2023-07-04 DIAGNOSIS — K449 Diaphragmatic hernia without obstruction or gangrene: Secondary | ICD-10-CM | POA: Diagnosis not present

## 2023-08-12 ENCOUNTER — Other Ambulatory Visit: Payer: Self-pay

## 2023-08-12 ENCOUNTER — Other Ambulatory Visit: Payer: Self-pay | Admitting: Internal Medicine
